# Patient Record
Sex: Male | Born: 1970 | Race: White | Hispanic: No | Marital: Married | Smoking: Never smoker
Health system: Southern US, Community
[De-identification: ages and names within clinical notes are randomized; demographics above are authoritative.]

## PROBLEM LIST (undated history)

## (undated) DIAGNOSIS — R000 Tachycardia, unspecified: Secondary | ICD-10-CM

## (undated) DIAGNOSIS — R030 Elevated blood-pressure reading, without diagnosis of hypertension: Secondary | ICD-10-CM

## (undated) DIAGNOSIS — E669 Obesity, unspecified: Secondary | ICD-10-CM

## (undated) DIAGNOSIS — IMO0001 Reserved for inherently not codable concepts without codable children: Secondary | ICD-10-CM

## (undated) DIAGNOSIS — J45909 Unspecified asthma, uncomplicated: Secondary | ICD-10-CM

## (undated) DIAGNOSIS — E785 Hyperlipidemia, unspecified: Secondary | ICD-10-CM

## (undated) DIAGNOSIS — T7840XA Allergy, unspecified, initial encounter: Secondary | ICD-10-CM

## (undated) HISTORY — DX: Unspecified asthma, uncomplicated: J45.909

## (undated) HISTORY — DX: Obesity, unspecified: E66.9

## (undated) HISTORY — DX: Allergy, unspecified, initial encounter: T78.40XA

## (undated) HISTORY — DX: Reserved for inherently not codable concepts without codable children: IMO0001

## (undated) HISTORY — DX: Tachycardia, unspecified: R00.0

## (undated) HISTORY — DX: Hyperlipidemia, unspecified: E78.5

## (undated) HISTORY — DX: Elevated blood-pressure reading, without diagnosis of hypertension: R03.0

## (undated) HISTORY — PX: TONSILECTOMY/ADENOIDECTOMY WITH MYRINGOTOMY: SHX6125

---

## 2005-04-10 ENCOUNTER — Emergency Department: Payer: Self-pay | Admitting: General Practice

## 2011-02-16 ENCOUNTER — Emergency Department: Payer: Self-pay | Admitting: Emergency Medicine

## 2011-02-16 LAB — RAPID INFLUENZA A&B ANTIGENS

## 2011-12-09 LAB — PSA: PSA: 0.3

## 2013-07-17 ENCOUNTER — Emergency Department: Payer: Self-pay | Admitting: Emergency Medicine

## 2013-12-20 LAB — LIPID PANEL
Cholesterol: 203 mg/dL — AB (ref 0–200)
HDL: 46 mg/dL (ref 35–70)
LDL CALC: 124 mg/dL
Triglycerides: 163 mg/dL — AB (ref 40–160)

## 2013-12-20 LAB — HEMOGLOBIN A1C: HEMOGLOBIN A1C: 5.5 % (ref 4.0–6.0)

## 2014-10-05 ENCOUNTER — Other Ambulatory Visit: Payer: Self-pay | Admitting: Family Medicine

## 2014-10-07 NOTE — Telephone Encounter (Signed)
Not able to inform patient, his number is disconnected

## 2014-11-09 ENCOUNTER — Other Ambulatory Visit: Payer: Self-pay | Admitting: Family Medicine

## 2014-11-11 ENCOUNTER — Encounter: Payer: Self-pay | Admitting: Family Medicine

## 2014-11-11 NOTE — Telephone Encounter (Signed)
A letter has been printed and mailed per your request.

## 2014-11-11 NOTE — Telephone Encounter (Signed)
Per your request a letter has been printed out and mailed.

## 2014-11-11 NOTE — Telephone Encounter (Signed)
Have tried contacting patient, however, the number is disconnected. I also checked in the old system and it was the same number.

## 2014-12-10 ENCOUNTER — Ambulatory Visit (INDEPENDENT_AMBULATORY_CARE_PROVIDER_SITE_OTHER): Payer: Managed Care, Other (non HMO) | Admitting: Family Medicine

## 2014-12-10 ENCOUNTER — Encounter: Payer: Self-pay | Admitting: Family Medicine

## 2014-12-10 ENCOUNTER — Encounter (INDEPENDENT_AMBULATORY_CARE_PROVIDER_SITE_OTHER): Payer: Self-pay

## 2014-12-10 VITALS — BP 130/86 | HR 93 | Temp 98.5°F | Resp 16 | Ht 67.0 in | Wt 258.0 lb

## 2014-12-10 DIAGNOSIS — J4540 Moderate persistent asthma, uncomplicated: Secondary | ICD-10-CM

## 2014-12-10 DIAGNOSIS — J302 Other seasonal allergic rhinitis: Secondary | ICD-10-CM | POA: Insufficient documentation

## 2014-12-10 DIAGNOSIS — J309 Allergic rhinitis, unspecified: Secondary | ICD-10-CM | POA: Diagnosis not present

## 2014-12-10 DIAGNOSIS — Z79899 Other long term (current) drug therapy: Secondary | ICD-10-CM | POA: Diagnosis not present

## 2014-12-10 DIAGNOSIS — J3089 Other allergic rhinitis: Secondary | ICD-10-CM

## 2014-12-10 DIAGNOSIS — Z23 Encounter for immunization: Secondary | ICD-10-CM

## 2014-12-10 DIAGNOSIS — E785 Hyperlipidemia, unspecified: Secondary | ICD-10-CM | POA: Diagnosis not present

## 2014-12-10 DIAGNOSIS — E66813 Obesity, class 3: Secondary | ICD-10-CM | POA: Insufficient documentation

## 2014-12-10 MED ORDER — MONTELUKAST SODIUM 10 MG PO TABS: 10.0000 mg | ORAL_TABLET | Freq: Every day | ORAL | Status: DC

## 2014-12-10 MED ORDER — LEVOCETIRIZINE DIHYDROCHLORIDE 5 MG PO TABS: 5.0000 mg | ORAL_TABLET | Freq: Every evening | ORAL | Status: DC

## 2014-12-10 MED ORDER — ALBUTEROL SULFATE HFA 108 (90 BASE) MCG/ACT IN AERS: 2.0000 | INHALATION_SPRAY | Freq: Four times a day (QID) | RESPIRATORY_TRACT | Status: DC | PRN

## 2014-12-10 MED ORDER — BUDESONIDE-FORMOTEROL FUMARATE 160-4.5 MCG/ACT IN AERO: 2.0000 | INHALATION_SPRAY | Freq: Two times a day (BID) | RESPIRATORY_TRACT | Status: DC

## 2014-12-10 NOTE — Progress Notes (Signed)
Name: Matthew Barrett   MRN: [id number]    DOB: Jun 06, 1970   Date:12/10/2014       Progress Note  Subjective  Chief Complaint  Chief Complaint  Patient presents with  . Medication Refill    follow-up  . Allergic Rhinitis     sinus  . Asthma    SOB, wheezing    HPI  Asthma: He has symptoms more than twice weekly described as wheezing or SOB, with some limitations, usually does not wake up at night because of asthma, using albuterol in the afternoon, he is only using Symbicort one puff once of twice daily and not as prescribed  AR: symptoms worse this time of the year, nasal congestion and sinus pressure. He has not been using nasal steroid because he does not like it.   Obesity: he has gained weight since last visit, has SOB with activity, not very active, and is still drinking sodas. Discussed increase risk of morbidity and mortality and importance of life style modifications  Patient Active Problem List   Diagnosis Date Noted  . Perennial allergic rhinitis with seasonal variation 12/10/2014  . Asthma, moderate persistent 12/10/2014  . Dyslipidemia 12/10/2014  . Obesity, Class III, BMI 40-49.9 (morbid obesity) (HCC) 12/10/2014    Past Surgical History  Procedure Laterality Date  . Tonsilectomy/adenoidectomy with myringotomy      Family History  Problem Relation Age of Onset  . Hyperlipidemia Mother   . Hypertension Mother   . Hypothyroidism Mother   . Hyperlipidemia Sister   . Hypertension Sister   . Asthma Sister   . CAD Maternal Grandfather   . Hypertension Maternal Grandfather   . Congestive Heart Failure Paternal Grandfather     Social History   Social History  . Marital Status: Married    Spouse Name: N/A  . Number of Children: N/A  . Years of Education: N/A   Occupational History  . Not on file.   Social History Main Topics  . Smoking status: Never Smoker   . Smokeless tobacco: Never Used  . Alcohol Use: No  . Drug Use: Yes  . Sexual Activity:  Yes   Other Topics Concern  . Not on file   Social History Narrative  . No narrative on file     Current outpatient prescriptions:  .  albuterol (VENTOLIN HFA) 108 (90 BASE) MCG/ACT inhaler, Inhale 2 puffs into the lungs every 6 (six) hours as needed for wheezing or shortness of breath., Disp: 18 each, Rfl: 1 .  budesonide-formoterol (SYMBICORT) 160-4.5 MCG/ACT inhaler, Inhale 2 puffs into the lungs 2 (two) times daily., Disp: 1 Inhaler, Rfl: 2 .  levocetirizine (XYZAL) 5 MG tablet, Take 1 tablet (5 mg total) by mouth every evening., Disp: 90 tablet, Rfl: 1 .  montelukast (SINGULAIR) 10 MG tablet, Take 1 tablet (10 mg total) by mouth daily., Disp: 90 tablet, Rfl: 1  Allergies  Allergen Reactions  . Aspirin   . Tetanus Toxoids      ROS  Constitutional: Negative for fever some weight change.  Respiratory: Negative for cough , occasional  shortness of breath.   Cardiovascular: Negative for chest pain or palpitations.  Gastrointestinal: Negative for abdominal pain, no bowel changes.  Musculoskeletal: Negative for gait problem or joint swelling.  Skin: Negative for rash.  Neurological: Negative for dizziness or headache.  No other specific complaints in a complete review of systems (except as listed in HPI above).  Objective  Filed Vitals:   12/10/14 1022  BP: 130/86  Pulse: 93  Temp: 98.5 F (36.9 C)  TempSrc: Oral  Resp: 16  Height: 5\' 7"  (1.702 m)  Weight: 258 lb (117.028 kg)  SpO2: 96%    Body mass index is 40.4 kg/(m^2).  Physical Exam  Constitutional: Patient appears well-developed and well-nourished. Obese No distress.  HEENT: head atraumatic, normocephalic, pupils equal and reactive to light, TM scarring on both sides - history of tubes as a child, neck supple, throat within normal limits Cardiovascular: Normal rate, regular rhythm and normal heart sounds.  No murmur heard. No BLE edema. Pulmonary/Chest: Effort normal and breath sounds normal. No respiratory  distress. Abdominal: Soft.  There is no tenderness. Psychiatric: Patient has a normal mood and affect. behavior is normal. Judgment and thought content normal.  PHQ2/9: Depression screen PHQ 2/9 12/10/2014  Decreased Interest 0  Down, Depressed, Hopeless 0  PHQ - 2 Score 0     Fall Risk: Fall Risk  12/10/2014  Falls in the past year? Yes  Number falls in past yr: 1  Injury with Fall? Yes     Functional Status Survey: Is the patient deaf or have difficulty hearing?: No Does the patient have difficulty seeing, even when wearing glasses/contacts?: No Does the patient have difficulty concentrating, remembering, or making decisions?: No Does the patient have difficulty walking or climbing stairs?: No Does the patient have difficulty dressing or bathing?: No Does the patient have difficulty doing errands alone such as visiting a doctor's office or shopping?: No    Assessment & Plan  1. Asthma, moderate persistent, uncomplicated  Only using Symbicort one puff twice daily , advised to take it as directed, asthma not controlled, recheck spirometry next visit - albuterol (VENTOLIN HFA) 108 (90 BASE) MCG/ACT inhaler; Inhale 2 puffs into the lungs every 6 (six) hours as needed for wheezing or shortness of breath.  Dispense: 18 each; Refill: 1 - budesonide-formoterol (SYMBICORT) 160-4.5 MCG/ACT inhaler; Inhale 2 puffs into the lungs 2 (two) times daily.  Dispense: 1 Inhaler; Refill: 2 - montelukast (SINGULAIR) 10 MG tablet; Take 1 tablet (10 mg total) by mouth daily.  Dispense: 90 tablet; Refill: 1  2. Obesity, Class III, BMI 40-49.9 (morbid obesity) (HCC)  Discussed with the patient the risk posed by an increased BMI. Discussed importance of portion control, calorie counting and at least 150 minutes of physical activity weekly. Avoid sweet beverages and drink more water. Eat at least 6 servings of fruit and vegetables daily   3. Needs flu shot  - Flu Vaccine QUAD 36+ mos PF IM  (Fluarix & Fluzone Quad PF)  4. Perennial allergic rhinitis with seasonal variation  He does not like nasal spray - montelukast (SINGULAIR) 10 MG tablet; Take 1 tablet (10 mg total) by mouth daily.  Dispense: 90 tablet; Refill: 1 - levocetirizine (XYZAL) 5 MG tablet; Take 1 tablet (5 mg total) by mouth every evening.  Dispense: 90 tablet; Refill: 1  5. Dyslipidemia  -Lipid and comp panel

## 2015-03-12 ENCOUNTER — Other Ambulatory Visit
Admission: RE | Admit: 2015-03-12 | Discharge: 2015-03-12 | Disposition: A | Discharge status: Home / Self Care | Payer: 59 | Source: Ambulatory Visit | Attending: Family Medicine | Admitting: Family Medicine

## 2015-03-12 ENCOUNTER — Other Ambulatory Visit: Payer: Self-pay

## 2015-03-12 ENCOUNTER — Ambulatory Visit (INDEPENDENT_AMBULATORY_CARE_PROVIDER_SITE_OTHER): Payer: 59 | Admitting: Family Medicine

## 2015-03-12 ENCOUNTER — Encounter: Payer: Self-pay | Admitting: Family Medicine

## 2015-03-12 VITALS — BP 144/96 | HR 96 | Temp 97.8°F | Resp 18 | Ht 67.0 in | Wt 265.3 lb

## 2015-03-12 DIAGNOSIS — IMO0001 Reserved for inherently not codable concepts without codable children: Secondary | ICD-10-CM

## 2015-03-12 DIAGNOSIS — E66813 Obesity, class 3: Secondary | ICD-10-CM

## 2015-03-12 DIAGNOSIS — E785 Hyperlipidemia, unspecified: Secondary | ICD-10-CM | POA: Insufficient documentation

## 2015-03-12 DIAGNOSIS — J302 Other seasonal allergic rhinitis: Secondary | ICD-10-CM

## 2015-03-12 DIAGNOSIS — R030 Elevated blood-pressure reading, without diagnosis of hypertension: Secondary | ICD-10-CM | POA: Diagnosis not present

## 2015-03-12 DIAGNOSIS — J309 Allergic rhinitis, unspecified: Secondary | ICD-10-CM

## 2015-03-12 DIAGNOSIS — M545 Low back pain, unspecified: Secondary | ICD-10-CM

## 2015-03-12 DIAGNOSIS — Z79899 Other long term (current) drug therapy: Secondary | ICD-10-CM | POA: Diagnosis not present

## 2015-03-12 DIAGNOSIS — J4540 Moderate persistent asthma, uncomplicated: Secondary | ICD-10-CM | POA: Diagnosis not present

## 2015-03-12 DIAGNOSIS — J3089 Other allergic rhinitis: Secondary | ICD-10-CM

## 2015-03-12 LAB — COMPREHENSIVE METABOLIC PANEL
ALT: 52 U/L (ref 17–63)
AST: 31 U/L (ref 15–41)
Albumin: 4.1 g/dL (ref 3.5–5.0)
Alkaline Phosphatase: 75 U/L (ref 38–126)
Anion gap: 6 (ref 5–15)
BILIRUBIN TOTAL: 0.7 mg/dL (ref 0.3–1.2)
BUN: 14 mg/dL (ref 6–20)
CO2: 28 mmol/L (ref 22–32)
CREATININE: 1.04 mg/dL (ref 0.61–1.24)
Calcium: 9 mg/dL (ref 8.9–10.3)
Chloride: 104 mmol/L (ref 101–111)
GFR calc Af Amer: >60 mL/min (ref >60)
Glucose, Bld: 90 mg/dL (ref 65–99)
Potassium: 4.6 mmol/L (ref 3.5–5.1)
Sodium: 138 mmol/L (ref 135–145)
TOTAL PROTEIN: 7.4 g/dL (ref 6.5–8.1)

## 2015-03-12 LAB — LIPID PANEL
CHOLESTEROL: 209 mg/dL — AB (ref 0–200)
HDL: 49 mg/dL (ref >40)
LDL CALC: 131 mg/dL — AB (ref 0–99)
TRIGLYCERIDES: 146 mg/dL (ref <150)
Total CHOL/HDL Ratio: 4.3 RATIO
VLDL: 29 mg/dL (ref 0–40)

## 2015-03-12 MED ORDER — ALBUTEROL SULFATE HFA 108 (90 BASE) MCG/ACT IN AERS: 2.0000 | INHALATION_SPRAY | Freq: Four times a day (QID) | RESPIRATORY_TRACT | Status: DC | PRN

## 2015-03-12 MED ORDER — BUDESONIDE-FORMOTEROL FUMARATE 160-4.5 MCG/ACT IN AERO: 2.0000 | INHALATION_SPRAY | Freq: Two times a day (BID) | RESPIRATORY_TRACT | Status: DC

## 2015-03-12 MED ORDER — METAXALONE 800 MG PO TABS: 800.0000 mg | ORAL_TABLET | Freq: Three times a day (TID) | ORAL | Status: DC | PRN

## 2015-03-12 NOTE — Telephone Encounter (Signed)
done

## 2015-03-12 NOTE — Progress Notes (Signed)
Name: Matthew Barrett   MRN: [id number]    DOB: 1970/04/22   Date:03/12/2015       Progress Note  Subjective  Chief Complaint  Chief Complaint  Patient presents with  . Asthma    patient is here for his 46-month follow-up. patient stated that it has not been good, especially at night.  . Medication Refill  . Back Pain    for the past week he has been having some lower back pain. ? if it is a strained muscle.    HPI  Asthma: He states he is doing better since resumed Symbicort three months ago, he states that during the day he has no problems, but in the evening , when sitting in the recliner he gets SOB and uses a rescue inhaler. Advised to try getting up and taking a deep breath. He has gained a lot of weight lately.   AR: symptoms He still has  nasal congestion and sinus pressure. He had an URI recently and is still stuffy. Using nasal steroid now.   Obesity: he has gained weight since last visit, he  is still drinking sodas. Discussed increase risk of morbidity and mortality and importance of life style modifications. He states he will start taking his health seriously now.   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 He describes as low aching back pain, no radiation. He has noticed some stiffness at the end of the day. No numbness or weakness, taking otc medication with some improvement of symptoms.   Patient Active Problem List   Diagnosis Date Noted  . Perennial allergic rhinitis with seasonal variation 12/10/2014  . Asthma, moderate persistent 12/10/2014  . Dyslipidemia 12/10/2014  . Obesity, Class III, BMI 40-49.9 (morbid obesity) (HCC) 12/10/2014    Past Surgical History  Procedure Laterality Date  . Tonsilectomy/adenoidectomy with myringotomy      Family History  Problem Relation Age of Onset  . Hyperlipidemia Mother   . Hypertension Mother   . Hypothyroidism Mother   .  Hyperlipidemia Sister   . Hypertension Sister   . Asthma Sister   . CAD Maternal Grandfather   . Hypertension Maternal Grandfather   . Congestive Heart Failure Paternal Grandfather     Social History   Social History  . Marital Status: Married    Spouse Name: N/A  . Number of Children: N/A  . Years of Education: N/A   Occupational History  . Not on file.   Social History Main Topics  . Smoking status: Never Smoker   . Smokeless tobacco: Never Used  . Alcohol Use: No  . Drug Use: Yes  . Sexual Activity: Yes   Other Topics Concern  . Not on file   Social History Narrative     Current outpatient prescriptions:  .  albuterol (VENTOLIN HFA) 108 (90 BASE) MCG/ACT inhaler, Inhale 2 puffs into the lungs every 6 (six) hours as needed for wheezing or shortness of breath., Disp: 18 each, Rfl: 1 .  budesonide-formoterol (SYMBICORT) 160-4.5 MCG/ACT inhaler, Inhale 2 puffs into the lungs 2 (two) times daily., Disp: 1 Inhaler, Rfl: 2 .  levocetirizine (XYZAL) 5 MG tablet, Take 1 tablet (5 mg total) by mouth every evening., Disp: 90 tablet, Rfl: 1 .  montelukast (SINGULAIR) 10 MG tablet, Take 1 tablet (10 mg total) by mouth daily., Disp: 90 tablet, Rfl: 1  Allergies  Allergen Reactions  .  Aspirin   . Tetanus Toxoids      ROS  Constitutional: Negative for fever, positive for weight change.  Respiratory: Positive  for cough and shortness of breath.   Cardiovascular: Negative for chest pain or palpitations.  Gastrointestinal: Negative for abdominal pain, no bowel changes.  Musculoskeletal: Negative for gait problem or joint swelling.  Skin: Negative for rash.  Neurological: Negative for dizziness or headache.  No other specific complaints in a complete review of systems (except as listed in HPI above).  Objective  Filed Vitals:   03/12/15 0901  BP: 132/92  Pulse: 96  Temp: 97.8 F (36.6 C)  TempSrc: Oral  Resp: 18  Height:  (1.702 m)  Weight: 265 lb 4.8 oz  (120.339 kg)  SpO2: 97%    Body mass index is 41.54 kg/(m^2).  Physical Exam  Constitutional: Patient appears well-developed and well-nourished. Obese  No distress.  HEENT: head atraumatic, normocephalic, pupils equal and reactive to light, ears TM right side has scarring , neck supple, throat within normal limits Cardiovascular: Normal rate, regular rhythm and normal heart sounds.  No murmur heard. No BLE edema. Pulmonary/Chest: Effort normal and breath sounds normal. No respiratory distress. Abdominal: Soft.  There is no tenderness. Psychiatric: Patient has a normal mood and affect. behavior is normal. Judgment and thought content normal.  PHQ2/9: Depression screen [facility] 2/9 03/12/2015 12/10/2014  Decreased Interest 0 0  Down, Depressed, Hopeless 0 0  PHQ - 2 Score 0 0     Fall Risk: Fall Risk  03/12/2015 12/10/2014  Falls in the past year? No Yes  Number falls in past yr: - 1  Injury with Fall? - Yes     Functional Status Survey: Is the patient deaf or have difficulty hearing?: No (patient has problems with his ears but not with hearing. hearing difficulty runs in his family.) Does the patient have difficulty seeing, even when wearing glasses/contacts?: No Does the patient have difficulty concentrating, remembering, or making decisions?: No Does the patient have difficulty walking or climbing stairs?: No Does the patient have difficulty dressing or bathing?: No Does the patient have difficulty doing errands alone such as visiting a doctor's office or shopping?: No    Assessment & Plan  1. Intermittent low back pain  We will try muscle relaxer and avoid taking nsaid's on regular basis - metaxalone (SKELAXIN) 800 MG tablet; Take 1 tablet (800 mg total) by mouth 3 (three) times daily as needed for muscle spasms.  Dispense: 90 tablet; Refill: 0  2. Obesity, Class III, BMI 40-49.9 (morbid obesity) (HCC)  Discussed with the patient the risk posed by an increased BMI.  Discussed importance of portion control, calorie counting and at least 150 minutes of physical activity weekly. Avoid sweet beverages and drink more water. Eat at least 6 servings of fruit and vegetables daily   3. Perennial allergic rhinitis with seasonal variation  Continue medication   4. Dyslipidemia   He needs to have labs done  5. Asthma, moderate persistent, uncomplicated  Advised to lose weight and get up to breath when he gets tired at night - budesonide-formoterol (SYMBICORT) 160-4.5 MCG/ACT inhaler; Inhale 2 puffs into the lungs 2 (two) times daily.  Dispense: 1 Inhaler; Refill: 2 - albuterol (VENTOLIN HFA) 108 (90 Base) MCG/ACT inhaler; Inhale 2 puffs into the lungs every 6 (six) hours as needed for wheezing or shortness of breath.  Dispense: 18 each; Refill: 0  6. Elevated blood pressure  He will change his diet, lose  weight and avoid caffeine, we will recheck bp in 3 months and if still high we will start medication

## 2015-03-12 NOTE — Telephone Encounter (Signed)
Got a fax from [facility] stating, "MUST USE PROAIR PER INS."  Refill request was sent to Dr. Alba Cory for approval and submission.

## 2015-03-27 ENCOUNTER — Other Ambulatory Visit: Payer: Self-pay | Admitting: Family Medicine

## 2015-03-27 MED ORDER — FLUTICASONE FUROATE-VILANTEROL 100-25 MCG/INH IN AEPB: 1.0000 | INHALATION_SPRAY | Freq: Every day | RESPIRATORY_TRACT | Status: DC

## 2015-06-11 ENCOUNTER — Ambulatory Visit: Payer: 59 | Admitting: Family Medicine

## 2015-06-19 ENCOUNTER — Other Ambulatory Visit: Payer: Self-pay | Admitting: Family Medicine

## 2015-06-19 NOTE — Telephone Encounter (Signed)
Patient requesting refill. 

## 2015-06-20 NOTE — Telephone Encounter (Signed)
Left voice message to inform patient that prescription has been called in and he need to schedule appt

## 2015-06-23 ENCOUNTER — Other Ambulatory Visit: Payer: Self-pay | Admitting: Family Medicine

## 2015-06-24 NOTE — Telephone Encounter (Signed)
Patient requesting refill. 

## 2015-07-02 ENCOUNTER — Other Ambulatory Visit: Payer: Self-pay | Admitting: Family Medicine

## 2015-07-03 ENCOUNTER — Encounter: Payer: Self-pay | Admitting: Family Medicine

## 2015-07-03 ENCOUNTER — Ambulatory Visit (INDEPENDENT_AMBULATORY_CARE_PROVIDER_SITE_OTHER): Payer: 59 | Admitting: Family Medicine

## 2015-07-03 VITALS — BP 140/80 | HR 110 | Temp 98.4°F | Resp 16 | Ht 67.0 in | Wt 259.7 lb

## 2015-07-03 DIAGNOSIS — J40 Bronchitis, not specified as acute or chronic: Secondary | ICD-10-CM | POA: Diagnosis not present

## 2015-07-03 DIAGNOSIS — J3089 Other allergic rhinitis: Secondary | ICD-10-CM

## 2015-07-03 DIAGNOSIS — J4540 Moderate persistent asthma, uncomplicated: Secondary | ICD-10-CM

## 2015-07-03 DIAGNOSIS — J309 Allergic rhinitis, unspecified: Secondary | ICD-10-CM

## 2015-07-03 DIAGNOSIS — J302 Other seasonal allergic rhinitis: Secondary | ICD-10-CM

## 2015-07-03 MED ORDER — LEVOCETIRIZINE DIHYDROCHLORIDE 5 MG PO TABS: 5.0000 mg | ORAL_TABLET | Freq: Every evening | ORAL | Status: DC

## 2015-07-03 MED ORDER — FLUTICASONE FUROATE-VILANTEROL 100-25 MCG/INH IN AEPB
1.0000 | INHALATION_SPRAY | Freq: Every day | RESPIRATORY_TRACT | Status: DC
Start: 2015-07-03 — End: 2015-11-24

## 2015-07-03 MED ORDER — ALBUTEROL SULFATE HFA 108 (90 BASE) MCG/ACT IN AERS: 2.0000 | INHALATION_SPRAY | Freq: Four times a day (QID) | RESPIRATORY_TRACT | Status: DC | PRN

## 2015-07-03 MED ORDER — AZITHROMYCIN 250 MG PO TABS: ORAL_TABLET | ORAL | Status: DC

## 2015-07-03 MED ORDER — BENZONATATE 100 MG PO CAPS: 100.0000 mg | ORAL_CAPSULE | Freq: Two times a day (BID) | ORAL | Status: DC | PRN

## 2015-07-03 NOTE — Progress Notes (Signed)
Name: Matthew Barrett   MRN: [id number]    DOB: [birth year]   Date:07/03/2015       Progress Note  Subjective  Chief Complaint  Chief Complaint  Patient presents with  . URI    cough, congested for 1 week    Cough This is a new problem. The current episode started in the past 7 days. The cough is productive of sputum. Associated symptoms include nasal congestion, postnasal drip and a sore throat. Pertinent negatives include no chills, fever, headaches or shortness of breath. Treatments tried: Ibuprofen and Allegra.   Asthma: Pt. Requesting refill for Breo Inhaler, taken daily for maintenance therapy for Asthma.    Seasonal Allergies:  Pt. Is also requesting a refill for Xyzal for seasonal allergies, recently ran out and is now taking OTC Allegra, which is not as effective as Xyzal.  Past Medical History  Diagnosis Date  . Hyperlipidemia   . Tachycardia   . Elevated blood pressure   . Obesity   . Allergy   . Asthma     Past Surgical History  Procedure Laterality Date  . Tonsilectomy/adenoidectomy with myringotomy      Family History  Problem Relation Age of Onset  . Hyperlipidemia Mother   . Hypertension Mother   . Hypothyroidism Mother   . Hyperlipidemia Sister   . Hypertension Sister   . Asthma Sister   . CAD Maternal Grandfather   . Hypertension Maternal Grandfather   . Congestive Heart Failure Paternal Grandfather     Social History   Social History  . Marital Status: Married    Spouse Name: N/A  . Number of Children: N/A  . Years of Education: N/A   Occupational History  . Not on file.   Social History Main Topics  . Smoking status: Never Smoker   . Smokeless tobacco: Never Used  . Alcohol Use: No  . Drug Use: Yes  . Sexual Activity: Yes   Other Topics Concern  . Not on file   Social History Narrative     Current outpatient prescriptions:  .  Fluticasone Furoate-Vilanterol (BREO ELLIPTA) 100-25 MCG/INH AEPB, Inhale 1 puff into the lungs  daily., Disp: 60 each, Rfl: 2 .  levocetirizine (XYZAL) 5 MG tablet, Take 1 tablet (5 mg total) by mouth every evening., Disp: 90 tablet, Rfl: 1 .  metaxalone (SKELAXIN) 800 MG tablet, Take 1 tablet (800 mg total) by mouth 3 (three) times daily as needed for muscle spasms., Disp: 90 tablet, Rfl: 0 .  montelukast (SINGULAIR) 10 MG tablet, Take 1 tablet (10 mg total) by mouth daily., Disp: 90 tablet, Rfl: 1 .  PROAIR HFA 108 (90 Base) MCG/ACT inhaler, INHALE TWO PUFFS BY MOUTH EVERY 6 HOURS AS NEEDED FOR WHEEZING OR SHORTNESS OF BREATH, Disp: 1 each, Rfl: 0  Allergies  Allergen Reactions  . Aspirin   . Tetanus Toxoids     Review of Systems  Constitutional: Negative for fever and chills.  HENT: Positive for congestion, postnasal drip and sore throat.   Respiratory: Positive for cough and sputum production. Negative for shortness of breath.   Neurological: Negative for headaches.    Objective  Filed Vitals:   07/03/15 1518  BP: 144/82  Pulse: 110  Temp: 98.4 F (36.9 C)  TempSrc: Oral  Resp: 16  Height:  (1.702 m)  Weight: 259 lb 11.2 oz (117.799 kg)  SpO2: 96%    Physical Exam  Constitutional: He is well-developed, well-nourished, and in no distress.  HENT:  Nose: Right sinus exhibits no maxillary sinus tenderness and no frontal sinus tenderness. Left sinus exhibits no maxillary sinus tenderness and no frontal sinus tenderness.  Mouth/Throat: Posterior oropharyngeal erythema present.  Cardiovascular: Normal heart sounds.  Tachycardia present.   Pulmonary/Chest: No respiratory distress. He has wheezes. He has no rhonchi.  Nursing note and vitals reviewed.    Assessment & Plan  1. Asthma, moderate persistent, uncomplicated Refill for maintenance and rescue inhalers provided. - fluticasone furoate-vilanterol (BREO ELLIPTA) 100-25 MCG/INH AEPB; Inhale 1 puff into the lungs daily.  Dispense: 60 each; Refill: 2 - albuterol (PROAIR HFA) 108 (90 Base) MCG/ACT inhaler;  Inhale 2 puffs into the lungs every 6 (six) hours as needed for wheezing or shortness of breath.  Dispense: 1 each; Refill: 2  2. Perennial allergic rhinitis with seasonal variation  - levocetirizine (XYZAL) 5 MG tablet; Take 1 tablet (5 mg total) by mouth every evening.  Dispense: 90 tablet; Refill: 1  3. Bronchitis We will start on Z-Pak and antitussive for symptom resolution. If no improvement within 2-3 days, may need a CXR. - azithromycin (ZITHROMAX) 250 MG tablet; 2 tabs po day 1, then 1 tab po q day x 4 days  Dispense: 6 tablet; Refill: 0 - benzonatate (TESSALON) 100 MG capsule; Take 1 capsule (100 mg total) by mouth 2 (two) times daily as needed for cough.  Dispense: 20 capsule; Refill: 0   Matthew Barrett Matthew Barrett [facility] 07/03/2015 3:28 PM

## 2015-08-01 ENCOUNTER — Ambulatory Visit: Payer: 59 | Admitting: Family Medicine

## 2015-08-20 ENCOUNTER — Ambulatory Visit: Payer: 59 | Admitting: Family Medicine

## 2015-09-23 ENCOUNTER — Encounter: Payer: 59 | Admitting: Family Medicine

## 2015-11-24 ENCOUNTER — Ambulatory Visit (INDEPENDENT_AMBULATORY_CARE_PROVIDER_SITE_OTHER): Payer: 59 | Admitting: Family Medicine

## 2015-11-24 ENCOUNTER — Encounter: Payer: Self-pay | Admitting: Family Medicine

## 2015-11-24 VITALS — BP 142/100 | HR 103 | Temp 97.7°F | Resp 18 | Ht 67.0 in | Wt 264.4 lb

## 2015-11-24 DIAGNOSIS — J302 Other seasonal allergic rhinitis: Secondary | ICD-10-CM | POA: Diagnosis not present

## 2015-11-24 DIAGNOSIS — J3089 Other allergic rhinitis: Secondary | ICD-10-CM | POA: Diagnosis not present

## 2015-11-24 DIAGNOSIS — Z23 Encounter for immunization: Secondary | ICD-10-CM

## 2015-11-24 DIAGNOSIS — Z0000 Encounter for general adult medical examination without abnormal findings: Secondary | ICD-10-CM

## 2015-11-24 DIAGNOSIS — J4540 Moderate persistent asthma, uncomplicated: Secondary | ICD-10-CM | POA: Diagnosis not present

## 2015-11-24 DIAGNOSIS — Z6841 Body Mass Index (BMI) 40.0 and over, adult: Secondary | ICD-10-CM

## 2015-11-24 DIAGNOSIS — I10 Essential (primary) hypertension: Secondary | ICD-10-CM

## 2015-11-24 DIAGNOSIS — E785 Hyperlipidemia, unspecified: Secondary | ICD-10-CM | POA: Diagnosis not present

## 2015-11-24 DIAGNOSIS — Z131 Encounter for screening for diabetes mellitus: Secondary | ICD-10-CM | POA: Diagnosis not present

## 2015-11-24 LAB — CBC WITH DIFFERENTIAL/PLATELET
BASOS PCT: 0 %
Basophils Absolute: 0 cells/uL (ref 0–200)
EOS ABS: 198 {cells}/uL (ref 15–500)
Eosinophils Relative: 2 %
HEMATOCRIT: 46.5 % (ref 38.5–50.0)
HEMOGLOBIN: 15.4 g/dL (ref 13.2–17.1)
LYMPHS ABS: 2970 {cells}/uL (ref 850–3900)
LYMPHS PCT: 30 %
MCH: 30 pg (ref 27.0–33.0)
MCHC: 33.1 g/dL (ref 32.0–36.0)
MCV: 90.6 fL (ref 80.0–100.0)
MONO ABS: 693 {cells}/uL (ref 200–950)
MPV: 9 fL (ref 7.5–12.5)
Monocytes Relative: 7 %
NEUTROS PCT: 61 %
Neutro Abs: 6039 cells/uL (ref 1500–7800)
Platelets: 335 10*3/uL (ref 140–400)
RBC: 5.13 MIL/uL (ref 4.20–5.80)
RDW: 13.3 % (ref 11.0–15.0)
WBC: 9.9 10*3/uL (ref 3.8–10.8)

## 2015-11-24 LAB — LIPID PANEL
CHOL/HDL RATIO: 4.1 ratio (ref <=5.0)
Cholesterol: 194 mg/dL (ref 125–200)
HDL: 47 mg/dL (ref >=40)
LDL CALC: 118 mg/dL (ref <130)
Triglycerides: 145 mg/dL (ref <150)
VLDL: 29 mg/dL (ref <30)

## 2015-11-24 LAB — COMPLETE METABOLIC PANEL WITH GFR
ALBUMIN: 4.3 g/dL (ref 3.6–5.1)
ALK PHOS: 76 U/L (ref 40–115)
ALT: 40 U/L (ref 9–46)
AST: 26 U/L (ref 10–40)
BUN: 8 mg/dL (ref 7–25)
CALCIUM: 9.3 mg/dL (ref 8.6–10.3)
CHLORIDE: 104 mmol/L (ref 98–110)
CO2: 27 mmol/L (ref 20–31)
CREATININE: 1.09 mg/dL (ref 0.60–1.35)
GFR, Est Non African American: 83 mL/min (ref >=60)
Glucose, Bld: 79 mg/dL (ref 65–99)
Potassium: 5 mmol/L (ref 3.5–5.3)
Sodium: 141 mmol/L (ref 135–146)
Total Bilirubin: 0.4 mg/dL (ref 0.2–1.2)
Total Protein: 7.3 g/dL (ref 6.1–8.1)

## 2015-11-24 LAB — TSH: TSH: 1.8 m[IU]/L (ref 0.40–4.50)

## 2015-11-24 MED ORDER — FLUTICASONE FUROATE-VILANTEROL 100-25 MCG/INH IN AEPB: 1.0000 | INHALATION_SPRAY | Freq: Every day | RESPIRATORY_TRACT | 2 refills | Status: DC

## 2015-11-24 MED ORDER — LORCASERIN HCL ER 20 MG PO TB24: 1.0000 | ORAL_TABLET | Freq: Every day | ORAL | 1 refills | Status: DC

## 2015-11-24 MED ORDER — MONTELUKAST SODIUM 10 MG PO TABS: 10.0000 mg | ORAL_TABLET | Freq: Every day | ORAL | 1 refills | Status: DC

## 2015-11-24 MED ORDER — HYDROCHLOROTHIAZIDE 12.5 MG PO TABS
12.5000 mg | ORAL_TABLET | Freq: Every day | ORAL | 1 refills | Status: DC
Start: 2015-11-24 — End: 2016-05-28

## 2015-11-24 MED ORDER — LEVOCETIRIZINE DIHYDROCHLORIDE 5 MG PO TABS: 5.0000 mg | ORAL_TABLET | Freq: Every evening | ORAL | 1 refills | Status: DC

## 2015-11-24 NOTE — Progress Notes (Signed)
Name: Matthew Barrett   MRN: [id number]    DOB: August 01, 1970   Date:11/24/2015       Progress Note  Subjective  Chief Complaint  Chief Complaint  Patient presents with  . Annual Exam    HPI  Male Exam: he denies sexual, or urinary symptoms, he has gained some weight  Asthma: last flare was in May when he had bronchitis. He is taking Breo and singulair daily and states it seems to work all day, he only uses rescue inhaler at most twice week, usually at work it is very dusty and moving a lot.   AR: symptoms He still has  nasal congestion and sinus pressure, symptoms stable with medication  HTN: bp was elevated here in May and also at local pharmacy in the 140's range. He has been under more stress at work, because he is on managing position at the warehouse. He denies chest pain or palpitation, no weight loss  Obesity: he has gained weight since last visit, he is still drinking sodas. Discussed increase risk of morbidity and mortality and importance of life style modifications. He would like to take medication to help him lose weight, we will try Belviq   Patient Active Problem List   Diagnosis Date Noted  . Hypertension 11/24/2015  . Perennial allergic rhinitis with seasonal variation 12/10/2014  . Asthma, moderate persistent 12/10/2014  . Dyslipidemia 12/10/2014  . Obesity, Class III, BMI 40-49.9 (morbid obesity) (HCC) 12/10/2014    Past Surgical History:  Procedure Laterality Date  . TONSILECTOMY/ADENOIDECTOMY WITH MYRINGOTOMY      Family History  Problem Relation Age of Onset  . Hyperlipidemia Mother   . Hypertension Mother   . Hypothyroidism Mother   . Hyperlipidemia Sister   . Hypertension Sister   . Asthma Sister   . CAD Maternal Grandfather   . Hypertension Maternal Grandfather   . Congestive Heart Failure Paternal Grandfather     Social History   Social History  . Marital status: Married    Spouse name: N/A  . Number of children: N/A  . Years of  education: N/A   Occupational History  . Not on file.   Social History Main Topics  . Smoking status: Never Smoker  . Smokeless tobacco: Former Neurosurgeon    Types: Chew  . Alcohol use No  . Drug use: No  . Sexual activity: Yes    Partners: Female   Other Topics Concern  . Not on file   Social History Narrative  . No narrative on file     Current Outpatient Prescriptions:  .  albuterol (PROAIR HFA) 108 (90 Base) MCG/ACT inhaler, Inhale 2 puffs into the lungs every 6 (six) hours as needed for wheezing or shortness of breath., Disp: 1 each, Rfl: 2 .  fluticasone furoate-vilanterol (BREO ELLIPTA) 100-25 MCG/INH AEPB, Inhale 1 puff into the lungs daily., Disp: 60 each, Rfl: 2 .  levocetirizine (XYZAL) 5 MG tablet, Take 1 tablet (5 mg total) by mouth every evening., Disp: 90 tablet, Rfl: 1 .  montelukast (SINGULAIR) 10 MG tablet, Take 1 tablet (10 mg total) by mouth daily., Disp: 90 tablet, Rfl: 1  Allergies  Allergen Reactions  . Aspirin   . Tetanus Toxoids      ROS  Constitutional: Negative for fever, positive for weight change.  Respiratory: Negative for cough and shortness of breath.   Cardiovascular: Negative for chest pain or palpitations.  Gastrointestinal: Negative for abdominal pain, no bowel changes.  Musculoskeletal: Negative for gait problem  or joint swelling.  Skin: Negative for rash.  Neurological: Negative for dizziness , positive for intermittent headache.  No other specific complaints in a complete review of systems (except as listed in HPI above).  Objective  Vitals:   11/24/15 1336  BP: (!) 142/100  Pulse: (!) 103  Resp: 18  Temp: 97.7 F (36.5 C)  TempSrc: Oral  SpO2: 95%  Weight: 264 lb 6.4 oz (119.9 kg)  Height: 5\' 7"  (1.702 m)    Body mass index is 41.41 kg/m.  Physical Exam  Constitutional: Patient appears well-developed and obese No distress.  HENT: Head: Normocephalic and atraumatic. Ears: B TMs ok, no erythema or effusion; Nose: Nose  normal. Mouth/Throat: Oropharynx is clear and moist. No oropharyngeal exudate.  Eyes: Conjunctivae and EOM are normal. Pupils are equal, round, and reactive to light. No scleral icterus.  Neck: Normal range of motion. Neck supple. No JVD present. No thyromegaly present.  Cardiovascular: Normal rate, regular rhythm and normal heart sounds.  No murmur heard. No BLE edema. Pulmonary/Chest: Effort normal and breath sounds normal. No respiratory distress. Abdominal: Soft. Bowel sounds are normal, no distension. There is no tenderness. no masses MALE GENITALIA: Normal descended testes bilaterally, no masses palpated, no hernias, no lesions, no discharge RECTAL: not done Musculoskeletal: Normal range of motion, no joint effusions. No gross deformities Neurological: he is alert and oriented to person, place, and time. No cranial nerve deficit. Coordination, balance, strength, speech and gait are normal.  Skin: Skin is warm and dry. No rash noted. No erythema.  Psychiatric: Patient has a normal mood and affect. behavior is normal. Judgment and thought content normal.  PH[facility]2/9: [facility] 11/24/2015 07/03/2015 03/12/2015 12/10/2014  Decreased Interest 0 0 0 0  Down, Depressed, Hopeless 0 0 0 0  PHQ - 2 Score 0 0 0 0    Fall Risk: Fall Risk  11/24/2015 07/03/2015 03/12/2015 12/10/2014  Falls in the past year? No No No Yes  Number falls in past yr: - - - 1  Injury with Fall? - - - Yes     Functional Status Survey: Is the patient deaf or have difficulty hearing?: No Does the patient have difficulty seeing, even when wearing glasses/contacts?: No Does the patient have difficulty concentrating, remembering, or making decisions?: No Does the patient have difficulty walking or climbing stairs?: No Does the patient have difficulty dressing or bathing?: No Does the patient have difficulty doing errands alone such as visiting a doctor's office or shopping?: No    Assessment & Plan  1.  Well adult exam  Discussed importance of 150 minutes of physical activity weekly, eat two servings of fish weekly, eat one serving of tree nuts ( cashews, pistachios, pecans, almonds.Marland Kitchen.) every other day, eat 6 servings of fruit/vegetables daily and drink plenty of water and avoid sweet beverages.   2. Needs flu shot  - Flu Vaccine QUAD 36+ mos PF IM (Fluarix & Fluzone Quad PF)  3. Essential hypertension  - CBC with Differential/Platelet - COMPLETE METABOLIC PANEL WITH GFR - TSH  4. Obesity, Class III, BMI 40-49.9 (morbid obesity) (HCC)  Discussed with the patient the risk posed by an increased BMI. Discussed importance of portion control, calorie counting and at least 150 minutes of physical activity weekly. Avoid sweet beverages and drink more water. Eat at least 6 servings of fruit and vegetables daily   5. Perennial allergic rhinitis with seasonal variation  - levocetirizine (XYZAL) 5 MG tablet; Take 1 tablet (5 mg  total) by mouth every evening.  Dispense: 90 tablet; Refill: 1 - montelukast (SINGULAIR) 10 MG tablet; Take 1 tablet (10 mg total) by mouth daily.  Dispense: 90 tablet; Refill: 1  6. Dyslipidemia  - Lipid panel  7. Moderate persistent asthma without complication  - fluticasone furoate-vilanterol (BREO ELLIPTA) 100-25 MCG/INH AEPB; Inhale 1 puff into the lungs daily.  Dispense: 60 each; Refill: 2  8. Screening for diabetes mellitus (DM)  - Hemoglobin A1c

## 2015-11-25 LAB — HEMOGLOBIN A1C
HEMOGLOBIN A1C: 5.2 % (ref <5.7)
MEAN PLASMA GLUCOSE: 103 mg/dL

## 2016-01-13 ENCOUNTER — Ambulatory Visit: Payer: 59 | Admitting: Family Medicine

## 2016-01-18 ENCOUNTER — Other Ambulatory Visit: Payer: Self-pay | Admitting: Family Medicine

## 2016-01-18 DIAGNOSIS — J4540 Moderate persistent asthma, uncomplicated: Secondary | ICD-10-CM

## 2016-02-27 ENCOUNTER — Other Ambulatory Visit: Payer: Self-pay | Admitting: Family Medicine

## 2016-02-27 DIAGNOSIS — J4540 Moderate persistent asthma, uncomplicated: Secondary | ICD-10-CM

## 2016-03-05 ENCOUNTER — Other Ambulatory Visit: Payer: Self-pay

## 2016-03-05 MED ORDER — MOMETASONE FURO-FORMOTEROL FUM 100-5 MCG/ACT IN AERO: 2.0000 | INHALATION_SPRAY | Freq: Two times a day (BID) | RESPIRATORY_TRACT | 0 refills | Status: DC

## 2016-03-05 NOTE — Telephone Encounter (Signed)
lvm to schedule appt and to inform prescription has been sent to pharmacy

## 2016-05-07 ENCOUNTER — Ambulatory Visit: Payer: 59 | Admitting: Family Medicine

## 2016-05-21 ENCOUNTER — Other Ambulatory Visit: Payer: Self-pay | Admitting: Family Medicine

## 2016-05-21 DIAGNOSIS — J4540 Moderate persistent asthma, uncomplicated: Secondary | ICD-10-CM

## 2016-05-28 ENCOUNTER — Ambulatory Visit (INDEPENDENT_AMBULATORY_CARE_PROVIDER_SITE_OTHER): Payer: BLUE CROSS/BLUE SHIELD | Admitting: Family Medicine

## 2016-05-28 ENCOUNTER — Encounter: Payer: Self-pay | Admitting: Family Medicine

## 2016-05-28 VITALS — BP 138/98 | HR 94 | Temp 98.3°F | Resp 16 | Ht 67.0 in | Wt 270.4 lb

## 2016-05-28 DIAGNOSIS — R0683 Snoring: Secondary | ICD-10-CM

## 2016-05-28 DIAGNOSIS — J3089 Other allergic rhinitis: Secondary | ICD-10-CM

## 2016-05-28 DIAGNOSIS — I10 Essential (primary) hypertension: Secondary | ICD-10-CM

## 2016-05-28 DIAGNOSIS — E785 Hyperlipidemia, unspecified: Secondary | ICD-10-CM | POA: Diagnosis not present

## 2016-05-28 DIAGNOSIS — J4540 Moderate persistent asthma, uncomplicated: Secondary | ICD-10-CM

## 2016-05-28 DIAGNOSIS — E781 Pure hyperglyceridemia: Secondary | ICD-10-CM

## 2016-05-28 DIAGNOSIS — Z23 Encounter for immunization: Secondary | ICD-10-CM | POA: Diagnosis not present

## 2016-05-28 DIAGNOSIS — J302 Other seasonal allergic rhinitis: Secondary | ICD-10-CM | POA: Diagnosis not present

## 2016-05-28 DIAGNOSIS — E66813 Obesity, class 3: Secondary | ICD-10-CM

## 2016-05-28 MED ORDER — BUDESONIDE-FORMOTEROL FUMARATE 160-4.5 MCG/ACT IN AERO
2.0000 | INHALATION_SPRAY | Freq: Two times a day (BID) | RESPIRATORY_TRACT | 2 refills | Status: DC
Start: 2016-05-28 — End: 2016-09-21

## 2016-05-28 MED ORDER — MONTELUKAST SODIUM 10 MG PO TABS: 10.0000 mg | ORAL_TABLET | Freq: Every day | ORAL | 1 refills | Status: DC

## 2016-05-28 MED ORDER — LEVOCETIRIZINE DIHYDROCHLORIDE 5 MG PO TABS
5.0000 mg | ORAL_TABLET | Freq: Every evening | ORAL | 1 refills | Status: DC
Start: 2016-05-28 — End: 2017-08-08

## 2016-05-28 MED ORDER — OLMESARTAN MEDOXOMIL-HCTZ 40-25 MG PO TABS: 0.5000 | ORAL_TABLET | Freq: Every day | ORAL | 0 refills | Status: DC

## 2016-05-28 NOTE — Progress Notes (Addendum)
Name: Matthew Barrett   MRN: [id number]    DOB: July 30, 1970   Date:05/28/2016       Progress Note  Subjective  Chief Complaint  Chief Complaint  Patient presents with  . Asthma  . Medication Refill    HPI  Asthma:he is now off Dulera because of cost. He states cough was severe a couple of weeks ago with weather changes, and had to use rescue inhaler more often. He states he usually does well on medication. He has intermittent SOB or wheezing, he also has a dry cough a couple of times a week.   AR: symptoms He shas nasal congestion, sneezing and rhinorrhea, watery eyes and it is worse this time of the year  HTN: bp hs been elevated even with HCTZ , at local pharmacy 140's/100's, no chest pain or palpitation. He has changed his diet, avoiding a lot of caffeine and salt. Still loves coffee   Obesity: he has gained weight since last visit, he is still drinking sodas but cutting down, at most 20 ounces per day. . Discussed increase risk of morbidity and mortality and importance of life style modifications. He never filled Belviq because of possible side effects.   Dyslipidemia: triglycerides has improved, but he has features of metabolic syndrome.      Patient Active Problem List   Diagnosis Date Noted  . Hypertriglyceridemia 05/28/2016  . Hypertension 11/24/2015  . Perennial allergic rhinitis with seasonal variation 12/10/2014  . Asthma, moderate persistent 12/10/2014  . Dyslipidemia 12/10/2014  . Obesity, Class III, BMI 40-49.9 (morbid obesity) (HCC) 12/10/2014    Past Surgical History:  Procedure Laterality Date  . TONSILECTOMY/ADENOIDECTOMY WITH MYRINGOTOMY      Family History  Problem Relation Age of Onset  . Hyperlipidemia Mother   . Hypertension Mother   . Hypothyroidism Mother   . Hyperlipidemia Sister   . Hypertension Sister   . Asthma Sister   . CAD Maternal Grandfather   . Hypertension Maternal Grandfather   . Congestive Heart Failure Paternal Grandfather      Social History   Social History  . Marital status: Married    Spouse name: N/A  . Number of children: N/A  . Years of education: N/A   Occupational History  . Not on file.   Social History Main Topics  . Smoking status: Never Smoker  . Smokeless tobacco: Former Neurosurgeon    Types: Chew  . Alcohol use No  . Drug use: No  . Sexual activity: Yes    Partners: Female   Other Topics Concern  . Not on file   Social History Narrative  . No narrative on file     Current Outpatient Prescriptions:  .  levocetirizine (XYZAL) 5 MG tablet, Take 1 tablet (5 mg total) by mouth every evening., Disp: 90 tablet, Rfl: 1 .  montelukast (SINGULAIR) 10 MG tablet, Take 1 tablet (10 mg total) by mouth daily., Disp: 90 tablet, Rfl: 1 .  PROAIR HFA 108 (90 Base) MCG/ACT inhaler, INHALE TWO PUFFS BY MOUTH EVERY 6 HOURS AS NEEDED FOR WHEEZING OR  SHORTNESS  OF  BREATH, Disp: 9 each, Rfl: 2 .  budesonide-formoterol (SYMBICORT) 160-4.5 MCG/ACT inhaler, Inhale 2 puffs into the lungs 2 (two) times daily., Disp: 1 Inhaler, Rfl: 2 .  olmesartan-hydrochlorothiazide (BENICAR HCT) 40-25 MG tablet, Take 0.5-1 tablets by mouth daily., Disp: 30 tablet, Rfl: 0  Allergies  Allergen Reactions  . Aspirin   . Tetanus Toxoids      ROS  Constitutional:  Negative for fever, positive for mild  weight change.  Respiratory: Positive for cough but no  shortness of breath.   Cardiovascular: Negative for chest pain or palpitations.  Gastrointestinal: Negative for abdominal pain, no bowel changes.  Musculoskeletal: Negative for gait problem or joint swelling.  Skin: Negative for rash.  Neurological: Negative for dizziness or headache.  No other specific complaints in a complete review of systems (except as listed in HPI above).  Objective  Vitals:   05/28/16 0944  BP: (!) 138/98  Pulse: 94  Resp: 16  Temp: 98.3 F (36.8 C)  SpO2: 95%  Weight: 270 lb 6 oz (122.6 kg)  Height:  (1.702 m)    Body mass  index is 42.35 kg/m.  Physical Exam  Constitutional: Patient appears well-developed and well-nourished. Obese  No distress.  HEENT: head atraumatic, normocephalic, pupils equal and reactive to light, neck supple, throat within normal limits Cardiovascular: Normal rate, regular rhythm and normal heart sounds.  No murmur heard. No BLE edema. Pulmonary/Chest: Effort normal and breath sounds normal. No respiratory distress. Abdominal: Soft.  There is no tenderness. Increase in abdominal girst Psychiatric: Patient has a normal mood and affect. behavior is normal. Judgment and thought content normal.  PHQ2/9: Depression screen [facility] 2/9 11/24/2015 07/03/2015 03/12/2015 12/10/2014  Decreased Interest 0 0 0 0  Down, Depressed, Hopeless 0 0 0 0  PHQ - 2 Score 0 0 0 0    Fall Risk: Fall Risk  11/24/2015 07/03/2015 03/12/2015 12/10/2014  Falls in the past year? No No No Yes  Number falls in past yr: - - - 1  Injury with Fall? - - - Yes     Assessment & Plan  1. Essential hypertension  bp has been elevated at home also, we will add Benicar - olmesartan-hydrochlorothiazide (BENICAR HCT) 40-25 MG tablet; Take 0.5-1 tablets by mouth daily.  Dispense: 30 tablet; Refill: 0 - EKG normal   2. Obesity, Class III, BMI 40-49.9 (morbid obesity) (HCC)  Discussed GLP-1 agonist. He denies family history of thyroid cancer . He is willing to try medication if elevated insulin level. He will return for samples and find out what is covered by insurance - Insulin, fasting  3. Dyslipidemia  Previous high triglycerides, increase abdominal girth, no history of high glucose  4. Perennial allergic rhinitis with seasonal variation  - levocetirizine (XYZAL) 5 MG tablet; Take 1 tablet (5 mg total) by mouth every evening.  Dispense: 90 tablet; Refill: 1 - montelukast (SINGULAIR) 10 MG tablet; Take 1 tablet (10 mg total) by mouth daily.  Dispense: 90 tablet; Refill: 1  5. Moderate persistent asthma without  complication  Out of Dulera because of cost, we will try Symbicort since we have free vouchers - montelukast (SINGULAIR) 10 MG tablet; Take 1 tablet (10 mg total) by mouth daily.  Dispense: 90 tablet; Refill: 1 - budesonide-formoterol (SYMBICORT) 160-4.5 MCG/ACT inhaler; Inhale 2 puffs into the lungs 2 (two) times daily.  Dispense: 1 Inhaler; Refill: 2  6. Hypertriglyceridemia   7. Snoring  Discussed sleep study, but he wants to try losing weight first  8. Need for pneumococcal vaccination  - Pneumococcal conjugate vaccine 13-valent IM

## 2016-05-28 NOTE — Patient Instructions (Signed)
Ozempic Victoza Trulicity  GLP1 agonist

## 2016-05-28 NOTE — Addendum Note (Signed)
Addended by: Alba Cory F on: 05/28/2016 10:52 AM   Modules accepted: Orders

## 2016-05-31 LAB — INSULIN, FASTING: INSULIN FASTING, SERUM: 12.5 u[IU]/mL (ref 2.0–19.6)

## 2016-07-01 ENCOUNTER — Ambulatory Visit (INDEPENDENT_AMBULATORY_CARE_PROVIDER_SITE_OTHER): Payer: BLUE CROSS/BLUE SHIELD | Admitting: Family Medicine

## 2016-07-01 ENCOUNTER — Encounter: Payer: Self-pay | Admitting: Family Medicine

## 2016-07-01 VITALS — BP 118/82 | HR 102 | Temp 98.0°F | Resp 16 | Ht 67.0 in | Wt 264.8 lb

## 2016-07-01 DIAGNOSIS — E8881 Metabolic syndrome: Secondary | ICD-10-CM | POA: Diagnosis not present

## 2016-07-01 DIAGNOSIS — E781 Pure hyperglyceridemia: Secondary | ICD-10-CM | POA: Diagnosis not present

## 2016-07-01 DIAGNOSIS — I10 Essential (primary) hypertension: Secondary | ICD-10-CM | POA: Diagnosis not present

## 2016-07-01 MED ORDER — OLMESARTAN MEDOXOMIL-HCTZ 40-25 MG PO TABS: 0.5000 | ORAL_TABLET | Freq: Every day | ORAL | 1 refills | Status: DC

## 2016-07-01 MED ORDER — SEMAGLUTIDE(0.25 OR 0.5MG/DOS) 2 MG/1.5ML ~~LOC~~ SOPN: 0.2500 mg | PEN_INJECTOR | SUBCUTANEOUS | 1 refills | Status: DC

## 2016-07-01 NOTE — Patient Instructions (Signed)
Prediabetes Eating Plan Prediabetes-also called impaired glucose tolerance or impaired fasting glucose-is a condition that causes blood sugar (blood glucose) levels to be higher than normal. Following a healthy diet can help to keep prediabetes under control. It can also help to lower the risk of type 2 diabetes and heart disease, which are increased in people who have prediabetes. Along with regular exercise, a healthy diet:  Promotes weight loss.  Helps to control blood sugar levels.  Helps to improve the way that the body uses insulin.  What do I need to know about this eating plan?  Use the glycemic index (GI) to plan your meals. The index tells you how quickly a food will raise your blood sugar. Choose low-GI foods. These foods take a longer time to raise blood sugar.  Pay close attention to the amount of carbohydrates in the food that you eat. Carbohydrates increase blood sugar levels.  Keep track of how many calories you take in. Eating the right amount of calories will help you to achieve a healthy weight. Losing about 7 percent of your starting weight can help to prevent type 2 diabetes.  You may want to follow a Mediterranean diet. This diet includes a lot of vegetables, lean meats or fish, whole grains, fruits, and healthy oils and fats. What foods can I eat? Grains Whole grains, such as whole-wheat or whole-grain breads, crackers, cereals, and pasta. Unsweetened oatmeal. Bulgur. Barley. Quinoa. Brown rice. Corn or whole-wheat flour tortillas or taco shells. Vegetables Lettuce. Spinach. Peas. Beets. Cauliflower. Cabbage. Broccoli. Carrots. Tomatoes. Squash. Eggplant. Herbs. Peppers. Onions. Cucumbers. Brussels sprouts. Fruits Berries. Bananas. Apples. Oranges. Grapes. Papaya. Mango. Pomegranate. Kiwi. Grapefruit. Cherries. Meats and Other Protein Sources Seafood. Lean meats, such as chicken and turkey or lean cuts of pork and beef. Tofu. Eggs. Nuts. Beans. Dairy Low-fat or  fat-free dairy products, such as yogurt, cottage cheese, and cheese. Beverages Water. Tea. Coffee. Sugar-free or diet soda. Seltzer water. Milk. Milk alternatives, such as soy or almond milk. Condiments Mustard. Relish. Low-fat, low-sugar ketchup. Low-fat, low-sugar barbecue sauce. Low-fat or fat-free mayonnaise. Sweets and Desserts Sugar-free or low-fat pudding. Sugar-free or low-fat ice cream and other frozen treats. Fats and Oils Avocado. Walnuts. Olive oil. The items listed above may not be a complete list of recommended foods or beverages. Contact your dietitian for more options. What foods are not recommended? Grains Refined white flour and flour products, such as bread, pasta, snack foods, and cereals. Beverages Sweetened drinks, such as sweet iced tea and soda. Sweets and Desserts Baked goods, such as cake, cupcakes, pastries, cookies, and cheesecake. The items listed above may not be a complete list of foods and beverages to avoid. Contact your dietitian for more information. This information is not intended to replace advice given to you by your health care provider. Make sure you discuss any questions you have with your health care provider. Document Released: 06/18/2014 Document Revised: 07/10/2015 Document Reviewed: 02/27/2014 Elsevier Interactive Patient Education  2017 [employer]  

## 2016-07-01 NOTE — Progress Notes (Signed)
Name: Matthew FairyDanny Beidler   MRN: [id number]    DOB: [birth year]   Date:07/01/2016       Progress Note  Subjective  Chief Complaint  Chief Complaint  Patient presents with  . Follow-up    1 month F/U  . Hypertension    Added Benicar last visit, doing well with BP at home has been running 120/80's  . Obesity    Giving up sugar drinks and has lost 6 pounds since last visit.    HPI  HTN: bp is at goal, on Benicar HCTZ, no side effects. Denies chest pain, dizziness  or palpitation.   Obesity: he has lost a little over 5 lbs since last visit,  he switched from regular soda to only diet drinks. He has  also decreasing portion size . He is willing to try GLP-1 agonist.   Metabolic syndrome: elevated bp, elevated triglycerides, increase in abdominal girth and increase insulin resistance. He has polydipsia but not polyphagia.   Patient Active Problem List   Diagnosis Date Noted  . Hypertriglyceridemia 05/28/2016  . Hypertension 11/24/2015  . Perennial allergic rhinitis with seasonal variation 12/10/2014  . Asthma, moderate persistent 12/10/2014  . Dyslipidemia 12/10/2014  . Obesity, Class III, BMI 40-49.9 (morbid obesity) (HCC) 12/10/2014    Past Surgical History:  Procedure Laterality Date  . TONSILECTOMY/ADENOIDECTOMY WITH MYRINGOTOMY      Family History  Problem Relation Age of Onset  . Hyperlipidemia Mother   . Hypertension Mother   . Hypothyroidism Mother   . Hyperlipidemia Sister   . Hypertension Sister   . Asthma Sister   . CAD Maternal Grandfather   . Hypertension Maternal Grandfather   . Congestive Heart Failure Paternal Grandfather     Social History   Social History  . Marital status: Married    Spouse name: N/A  . Number of children: N/A  . Years of education: N/A   Occupational History  . Not on file.   Social History Main Topics  . Smoking status: Never Smoker  . Smokeless tobacco: Former NeurosurgeonUser    Types: Chew  . Alcohol use No  . Drug use: No  .  Sexual activity: Yes    Partners: Female   Other Topics Concern  . Not on file   Social History Narrative  . No narrative on file     Current Outpatient Prescriptions:  .  budesonide-formoterol (SYMBICORT) 160-4.5 MCG/ACT inhaler, Inhale 2 puffs into the lungs 2 (two) times daily., Disp: 1 Inhaler, Rfl: 2 .  levocetirizine (XYZAL) 5 MG tablet, Take 1 tablet (5 mg total) by mouth every evening., Disp: 90 tablet, Rfl: 1 .  montelukast (SINGULAIR) 10 MG tablet, Take 1 tablet (10 mg total) by mouth daily., Disp: 90 tablet, Rfl: 1 .  olmesartan-hydrochlorothiazide (BENICAR HCT) 40-25 MG tablet, Take 0.5-1 tablets by mouth daily., Disp: 30 tablet, Rfl: 1 .  PROAIR HFA 108 (90 Base) MCG/ACT inhaler, INHALE TWO PUFFS BY MOUTH EVERY 6 HOURS AS NEEDED FOR WHEEZING OR  SHORTNESS  OF  BREATH, Disp: 9 each, Rfl: 2 .  Semaglutide (OZEMPIC) 0.25 or 0.5 MG/DOSE SOPN, Inject 0.25-0.5 mg into the skin once a week., Disp: 2 pen, Rfl: 1  Allergies  Allergen Reactions  . Aspirin   . Tetanus Toxoids      ROS  Constitutional: Negative for fever, positive for  weight change - lost some.  Respiratory: Negative for cough and shortness of breath.   Cardiovascular: Negative for chest pain or palpitations.  Gastrointestinal: Negative  for abdominal pain, no bowel changes.  Musculoskeletal: Negative for gait problem or joint swelling.  Skin: Negative for rash.  Neurological: Negative for dizziness or headache.  No other specific complaints in a complete review of systems (except as listed in HPI above).  Objective  Vitals:   07/01/16 0936  BP: 118/82  Pulse: (!) 102  Resp: 16  Temp: 98 F (36.7 C)  TempSrc: Oral  SpO2: 94%  Weight: 264 lb 12.8 oz (120.1 kg)  Height: 5\' 7"  (1.702 m)    Body mass index is 41.47 kg/m.  Physical Exam  Constitutional: Patient appears well-developed and well-nourished. Obese  No distress.  HEENT: head atraumatic, normocephalic, pupils equal and reactive to  light,  neck supple, throat within normal limits Cardiovascular: Normal rate, regular rhythm and normal heart sounds.  No murmur heard. No BLE edema. Pulmonary/Chest: Effort normal and breath sounds normal. No respiratory distress. Abdominal: Soft.  There is no tenderness. Increase in abdominal girth  Psychiatric: Patient has a normal mood and affect. behavior is normal. Judgment and thought content normal.    Recent Results (from the past 2160 hour(s))  Insulin, fasting     Status: None   Collection Time: 05/28/16 11:02 AM  Result Value Ref Range   Insulin fasting, serum 12.5 2.0 - 19.6 uIU/mL    Comment:   This insulin assay shows strong cross-reactivity for some insulin analogs (lispro, aspart, and glargine) and much lower cross-reactivity with others (detemir, glulisine).   Stimulated Insulin reference intervals were established using the Siemens Immulite assay. These values are provided for general guidance only.      PHQ2/9: Depression screen [facility] 2/9 07/01/2016 11/24/2015 07/03/2015 03/12/2015 12/10/2014  Decreased Interest 0 0 0 0 0  Down, Depressed, Hopeless 0 0 0 0 0  PHQ - 2 Score 0 0 0 0 0     Fall Risk: Fall Risk  07/01/2016 11/24/2015 07/03/2015 03/12/2015 12/10/2014  Falls in the past year? No No No No Yes  Number falls in past yr: - - - - 1  Injury with Fall? - - - - Yes    Functional Status Survey: Is the patient deaf or have difficulty hearing?: No Does the patient have difficulty seeing, even when wearing glasses/contacts?: No Does the patient have difficulty concentrating, remembering, or making decisions?: No Does the patient have difficulty walking or climbing stairs?: No Does the patient have difficulty dressing or bathing?: No Does the patient have difficulty doing errands alone such as visiting a doctor's office or shopping?: No   Assessment & Plan  1. Essential hypertension  Doing well on medication  - olmesartan-hydrochlorothiazide (BENICAR HCT)  40-25 MG tablet; Take 0.5-1 tablets by mouth daily.  Dispense: 30 tablet; Refill: 1  2. Obesity, Class III, BMI 40-49.9 (morbid obesity) (HCC)  - Semaglutide (OZEMPIC) 0.25 or 0.5 MG/DOSE SOPN; Inject 0.25-0.5 mg into the skin once a week.  Dispense: 2 pen; Refill: 1  Discussed all optiosns for weight loss medications including Belviq, Qsymia, Saxenda and Contrave. Discussed risk and benefits of each of them.  3. Hypertriglyceridemia  Recheck labs next visit  4. Metabolic syndrome  - Semaglutide (OZEMPIC) 0.25 or 0.5 MG/DOSE SOPN; Inject 0.25-0.5 mg into the skin once a week.  Dispense: 2 pen; Refill: 1

## 2016-08-31 ENCOUNTER — Ambulatory Visit: Payer: BLUE CROSS/BLUE SHIELD | Admitting: Family Medicine

## 2016-09-21 ENCOUNTER — Encounter: Payer: Self-pay | Admitting: Family Medicine

## 2016-09-21 ENCOUNTER — Ambulatory Visit (INDEPENDENT_AMBULATORY_CARE_PROVIDER_SITE_OTHER): Payer: BLUE CROSS/BLUE SHIELD | Admitting: Family Medicine

## 2016-09-21 VITALS — BP 114/70 | HR 99 | Temp 98.2°F | Resp 16 | Ht 67.0 in | Wt 251.8 lb

## 2016-09-21 DIAGNOSIS — J4540 Moderate persistent asthma, uncomplicated: Secondary | ICD-10-CM | POA: Diagnosis not present

## 2016-09-21 DIAGNOSIS — E8881 Metabolic syndrome: Secondary | ICD-10-CM | POA: Diagnosis not present

## 2016-09-21 DIAGNOSIS — E781 Pure hyperglyceridemia: Secondary | ICD-10-CM | POA: Diagnosis not present

## 2016-09-21 DIAGNOSIS — L989 Disorder of the skin and subcutaneous tissue, unspecified: Secondary | ICD-10-CM

## 2016-09-21 DIAGNOSIS — I10 Essential (primary) hypertension: Secondary | ICD-10-CM

## 2016-09-21 DIAGNOSIS — E785 Hyperlipidemia, unspecified: Secondary | ICD-10-CM | POA: Diagnosis not present

## 2016-09-21 LAB — CBC WITH DIFFERENTIAL/PLATELET
BASOS PCT: 0 %
Basophils Absolute: 0 cells/uL (ref 0–200)
EOS PCT: 3 %
Eosinophils Absolute: 213 cells/uL (ref 15–500)
HCT: 46.8 % (ref 38.5–50.0)
Hemoglobin: 15.6 g/dL (ref 13.2–17.1)
LYMPHS PCT: 20 %
Lymphs Abs: 1420 cells/uL (ref 850–3900)
MCH: 30.4 pg (ref 27.0–33.0)
MCHC: 33.3 g/dL (ref 32.0–36.0)
MCV: 91.2 fL (ref 80.0–100.0)
MPV: 9.2 fL (ref 7.5–12.5)
Monocytes Absolute: 568 cells/uL (ref 200–950)
Monocytes Relative: 8 %
NEUTROS PCT: 69 %
Neutro Abs: 4899 cells/uL (ref 1500–7800)
PLATELETS: 330 10*3/uL (ref 140–400)
RBC: 5.13 MIL/uL (ref 4.20–5.80)
RDW: 13.4 % (ref 11.0–15.0)
WBC: 7.1 10*3/uL (ref 3.8–10.8)

## 2016-09-21 MED ORDER — BUDESONIDE-FORMOTEROL FUMARATE 160-4.5 MCG/ACT IN AERO: 2.0000 | INHALATION_SPRAY | Freq: Two times a day (BID) | RESPIRATORY_TRACT | 2 refills | Status: DC

## 2016-09-21 MED ORDER — OLMESARTAN MEDOXOMIL 20 MG PO TABS: 20.0000 mg | ORAL_TABLET | Freq: Every day | ORAL | 0 refills | Status: DC

## 2016-09-21 NOTE — Progress Notes (Signed)
Name: Matthew Barrett   MRN: [id number]    DOB: [birth year]   Date:09/21/2016       Progress Note  Subjective  Chief Complaint  Chief Complaint  Patient presents with  . Follow-up    2 month F/U  . Obesity    Doing well with Ozempic has lost 14 pounds since last visit. Having some nausea with medication    HPI   HTN: bp is at goal, on Benicar HCTZ, half pill daily and bp is still towards low end of normal.  Denies chest pain, dizziness  or palpitation.   Obesity: he has lost a little over 14 lbs since last visit,  he switched from regular soda to only diet drinks. He has  also decreasing portion size . He is now on Ozempic and is curbing his appetite , doing well on medication, has some nausea for a few days after using Ozempic.   Metabolic syndrome: elevated bp, elevated triglycerides, increase in abdominal girth and increase insulin resistance. He states he is doing much better after he started Ozempic, noticed decrease in appetite, and not having polyphagia or polydipsia.   Asthma Moderate: doing well on medication, denies any wheezing, SOB or chest tightness.    Patient Active Problem List   Diagnosis Date Noted  . Hypertriglyceridemia 05/28/2016  . Hypertension 11/24/2015  . Perennial allergic rhinitis with seasonal variation 12/10/2014  . Asthma, moderate persistent 12/10/2014  . Dyslipidemia 12/10/2014  . Obesity, Class III, BMI 40-49.9 (morbid obesity) (HCC) 12/10/2014    Past Surgical History:  Procedure Laterality Date  . TONSILECTOMY/ADENOIDECTOMY WITH MYRINGOTOMY      Family History  Problem Relation Age of Onset  . Hyperlipidemia Mother   . Hypertension Mother   . Hypothyroidism Mother   . Hyperlipidemia Sister   . Hypertension Sister   . Asthma Sister   . CAD Maternal Grandfather   . Hypertension Maternal Grandfather   . Congestive Heart Failure Paternal Grandfather     Social History   Social History  . Marital status: Married    Spouse name:  N/A  . Number of children: N/A  . Years of education: N/A   Occupational History  . Not on file.   Social History Main Topics  . Smoking status: Never Smoker  . Smokeless tobacco: Former NeurosurgeonUser    Types: Chew  . Alcohol use No  . Drug use: No  . Sexual activity: Yes    Partners: Female   Other Topics Concern  . Not on file   Social History Narrative  . No narrative on file     Current Outpatient Prescriptions:  .  budesonide-formoterol (SYMBICORT) 160-4.5 MCG/ACT inhaler, Inhale 2 puffs into the lungs 2 (two) times daily., Disp: 1 Inhaler, Rfl: 2 .  levocetirizine (XYZAL) 5 MG tablet, Take 1 tablet (5 mg total) by mouth every evening., Disp: 90 tablet, Rfl: 1 .  montelukast (SINGULAIR) 10 MG tablet, Take 1 tablet (10 mg total) by mouth daily., Disp: 90 tablet, Rfl: 1 .  olmesartan-hydrochlorothiazide (BENICAR HCT) 40-25 MG tablet, Take 0.5-1 tablets by mouth daily., Disp: 30 tablet, Rfl: 1 .  PROAIR HFA 108 (90 Base) MCG/ACT inhaler, INHALE TWO PUFFS BY MOUTH EVERY 6 HOURS AS NEEDED FOR WHEEZING OR  SHORTNESS  OF  BREATH, Disp: 9 each, Rfl: 2 .  Semaglutide (OZEMPIC) 0.25 or 0.5 MG/DOSE SOPN, Inject 0.25-0.5 mg into the skin once a week., Disp: 2 pen, Rfl: 1  Allergies  Allergen Reactions  . Aspirin   .  Tetanus Toxoids Other (See Comments)    Flared up Asthma     ROS  Constitutional: Negative for fever , positive for  weight change.  Respiratory: Negative for cough and shortness of breath.   Cardiovascular: Negative for chest pain or palpitations.  Gastrointestinal: Negative for abdominal pain, no bowel changes.  Musculoskeletal: Negative for gait problem or joint swelling.  Skin: Negative for rash.  Neurological: Negative for dizziness or headache.  No other specific complaints in a complete review of systems (except as listed in HPI above).  Objective  Vitals:   09/21/16 1102  BP: 114/70  Pulse: 99  Resp: 16  Temp: 98.2 F (36.8 C)  TempSrc: Oral  SpO2:  97%  Weight: 251 lb 12.8 oz (114.2 kg)  Height: 5\' 7"  (1.702 m)    Body mass index is 39.44 kg/m.  Physical Exam  Constitutional: Patient appears well-developed and well-nourished. Obese  No distress.  HEENT: head atraumatic, normocephalic, pupils equal and reactive to light,  neck supple, throat within normal limits Cardiovascular: Normal rate, regular rhythm and normal heart sounds.  No murmur heard. No BLE edema. Pulmonary/Chest: Effort normal and breath sounds normal. No respiratory distress. Abdominal: Soft.  There is no tenderness. Psychiatric: Patient has a normal mood and affect. behavior is normal. Judgment and thought content normal. Skin Lesion: left anterior chest, brown with while halo around, got sunburn when he went to the beach  PHQ2/9: Depression screen [facility] 2/9 09/21/2016 07/01/2016 11/24/2015 07/03/2015 03/12/2015  Decreased Interest 0 0 0 0 0  Down, Depressed, Hopeless 0 0 0 0 0  PHQ - 2 Score 0 0 0 0 0    Fall Risk: Fall Risk  09/21/2016 07/01/2016 11/24/2015 07/03/2015 03/12/2015  Falls in the past year? No No No No No  Number falls in past yr: - - - - -  Injury with Fall? - - - - -  Comment - - - - -     Functional Status Survey: Is the patient deaf or have difficulty hearing?: No Does the patient have difficulty seeing, even when wearing glasses/contacts?: No Does the patient have difficulty concentrating, remembering, or making decisions?: No Does the patient have difficulty walking or climbing stairs?: No Does the patient have difficulty dressing or bathing?: No Does the patient have difficulty doing errands alone such as visiting a doctor's office or shopping?: No    Assessment & Plan  1. Essential hypertension  bp is low, taking half of Benicar 40/25, we will dc fluids pill and monitor - olmesartan (BENICAR) 20 MG tablet; Take 1 tablet (20 mg total) by mouth daily.  Dispense: 90 tablet; Refill: 0 - olmesartan (BENICAR) 20 MG tablet; Take 1 tablet (20  mg total) by mouth daily.  Dispense: 90 tablet; Refill: 0 - COMPLETE METABOLIC PANEL WITH GFR - CBC with Differential/Platelet  2. Obesity, Class III, BMI 40-49.9 (morbid obesity) (HCC)  Losing weight with life style modification and Ozempic - Insulin, fasting  3. Hypertriglyceridemia  Check lipid panel   4. Metabolic syndrome  - Hemoglobin A1c - Insulin, fasting  5. Dyslipidemia  - Lipid panel  6. Moderate persistent asthma without complication  - budesonide-formoterol (SYMBICORT) 160-4.5 MCG/ACT inhaler; Inhale 2 puffs into the lungs 2 (two) times daily.  Dispense: 1 Inhaler; Refill: 2

## 2016-09-22 LAB — HEMOGLOBIN A1C
Hgb A1c MFr Bld: 5.1 % (ref <5.7)
Mean Plasma Glucose: 100 mg/dL

## 2016-09-22 LAB — INSULIN, FASTING: INSULIN FASTING, SERUM: 16.7 u[IU]/mL (ref 2.0–19.6)

## 2016-09-23 LAB — COMPLETE METABOLIC PANEL WITH GFR
ALBUMIN: 4.5 g/dL (ref 3.6–5.1)
ALK PHOS: 75 U/L (ref 40–115)
ALT: 25 U/L (ref 9–46)
AST: 21 U/L (ref 10–40)
BUN: 17 mg/dL (ref 7–25)
CALCIUM: 10 mg/dL (ref 8.6–10.3)
CHLORIDE: 105 mmol/L (ref 98–110)
CO2: 18 mmol/L — ABNORMAL LOW (ref 20–32)
CREATININE: 1.24 mg/dL (ref 0.60–1.35)
GFR, Est African American: 81 mL/min (ref >=60)
GFR, Est Non African American: 70 mL/min (ref >=60)
Glucose, Bld: 86 mg/dL (ref 65–99)
POTASSIUM: 5 mmol/L (ref 3.5–5.3)
Sodium: 143 mmol/L (ref 135–146)
Total Bilirubin: 0.3 mg/dL (ref 0.2–1.2)
Total Protein: 7.4 g/dL (ref 6.1–8.1)

## 2016-09-23 LAB — LIPID PANEL
CHOL/HDL RATIO: 3.3 ratio (ref <5.0)
Cholesterol: 163 mg/dL (ref <200)
HDL: 50 mg/dL (ref >40)
LDL Cholesterol: 95 mg/dL (ref <100)
Triglycerides: 92 mg/dL (ref <150)
VLDL: 18 mg/dL (ref <30)

## 2016-10-08 ENCOUNTER — Ambulatory Visit: Payer: BLUE CROSS/BLUE SHIELD | Admitting: Family Medicine

## 2016-10-18 ENCOUNTER — Other Ambulatory Visit: Payer: Self-pay | Admitting: Family Medicine

## 2016-10-18 DIAGNOSIS — E8881 Metabolic syndrome: Secondary | ICD-10-CM

## 2016-10-19 NOTE — Telephone Encounter (Signed)
Patient requesting refill of Ozempic to [facility].  

## 2016-12-22 ENCOUNTER — Encounter: Payer: Self-pay | Admitting: Family Medicine

## 2016-12-22 ENCOUNTER — Ambulatory Visit (INDEPENDENT_AMBULATORY_CARE_PROVIDER_SITE_OTHER): Payer: BLUE CROSS/BLUE SHIELD | Admitting: Family Medicine

## 2016-12-22 VITALS — BP 114/80 | HR 86 | Temp 98.0°F | Resp 16 | Ht 67.0 in | Wt 255.0 lb

## 2016-12-22 DIAGNOSIS — J4540 Moderate persistent asthma, uncomplicated: Secondary | ICD-10-CM | POA: Diagnosis not present

## 2016-12-22 DIAGNOSIS — Z23 Encounter for immunization: Secondary | ICD-10-CM | POA: Diagnosis not present

## 2016-12-22 DIAGNOSIS — J302 Other seasonal allergic rhinitis: Secondary | ICD-10-CM | POA: Diagnosis not present

## 2016-12-22 DIAGNOSIS — I10 Essential (primary) hypertension: Secondary | ICD-10-CM | POA: Diagnosis not present

## 2016-12-22 DIAGNOSIS — J3089 Other allergic rhinitis: Secondary | ICD-10-CM | POA: Diagnosis not present

## 2016-12-22 DIAGNOSIS — E8881 Metabolic syndrome: Secondary | ICD-10-CM

## 2016-12-22 LAB — POCT GLYCOSYLATED HEMOGLOBIN (HGB A1C): Hemoglobin A1C: 5.4

## 2016-12-22 MED ORDER — MONTELUKAST SODIUM 10 MG PO TABS: 10.0000 mg | ORAL_TABLET | Freq: Every day | ORAL | 1 refills | Status: DC

## 2016-12-22 MED ORDER — BUDESONIDE-FORMOTEROL FUMARATE 160-4.5 MCG/ACT IN AERO
2.0000 | INHALATION_SPRAY | Freq: Two times a day (BID) | RESPIRATORY_TRACT | 2 refills | Status: DC
Start: 2016-12-22 — End: 2017-04-11

## 2016-12-22 MED ORDER — OLMESARTAN MEDOXOMIL 20 MG PO TABS: 20.0000 mg | ORAL_TABLET | Freq: Every day | ORAL | 0 refills | Status: DC

## 2016-12-22 NOTE — Progress Notes (Signed)
Name: Matthew Barrett   MRN: [id number]    DOB: [birth year]   Date:12/22/2016       Progress Note  Subjective  Chief Complaint  Chief Complaint  Patient presents with  . Medication Refill  . Hypertension  . Obesity  . Metabolic Syndrome  . Asthma    HPI  HTN: bp is at goal, on Benicar, off HCTZ.  Denies chest pain, dizziness or palpitation.   Obesity: he had  lost a little over 14 lbs in 3 months after started on Ozempic 06/2016, but he has been out of medication for the past month because of cost. He switched from regular soda to only diet drinks. He has also decreasing portion size. He has noticed appetite is increasing again since off Ozempic, gained 4 lbs over the past month.   Metabolic syndrome: elevated bp, elevated triglycerides, increase in abdominal girth and increase insulin resistance. He states he was doing much better after he started Ozempic, noticed decrease in appetite, and not having polyphagia or polydipsia. However since off medication feeling very hungry again  Asthma Moderate: doing well on medication, very seldom has a dry cough or  wheezing, denies SOB or chest tightness.    Patient Active Problem List   Diagnosis Date Noted  . Hypertriglyceridemia 05/28/2016  . Hypertension 11/24/2015  . Perennial allergic rhinitis with seasonal variation 12/10/2014  . Asthma, moderate persistent 12/10/2014  . Dyslipidemia 12/10/2014  . Obesity, Class III, BMI 40-49.9 (morbid obesity) (HCC) 12/10/2014    Past Surgical History:  Procedure Laterality Date  . TONSILECTOMY/ADENOIDECTOMY WITH MYRINGOTOMY      Family History  Problem Relation Age of Onset  . Hyperlipidemia Mother   . Hypertension Mother   . Hypothyroidism Mother   . Hyperlipidemia Sister   . Hypertension Sister   . Asthma Sister   . CAD Maternal Grandfather   . Hypertension Maternal Grandfather   . Congestive Heart Failure Paternal Grandfather     Social History   Socioeconomic History   . Marital status: Married    Spouse name: Not on file  . Number of children: Not on file  . Years of education: Not on file  . Highest education level: Not on file  Social Needs  . Financial resource strain: Not on file  . Food insecurity - worry: Not on file  . Food insecurity - inability: Not on file  . Transportation needs - medical: Not on file  . Transportation needs - non-medical: Not on file  Occupational History  . Not on file  Tobacco Use  . Smoking status: Never Smoker  . Smokeless tobacco: Former Neurosurgeon    Types: Chew  Substance and Sexual Activity  . Alcohol use: No    Alcohol/week: 0.0 oz  . Drug use: No  . Sexual activity: Yes    Partners: Female  Other Topics Concern  . Not on file  Social History Narrative  . Not on file     Current Outpatient Medications:  .  budesonide-formoterol (SYMBICORT) 160-4.5 MCG/ACT inhaler, Inhale 2 puffs into the lungs 2 (two) times daily., Disp: 1 Inhaler, Rfl: 2 .  levocetirizine (XYZAL) 5 MG tablet, Take 1 tablet (5 mg total) by mouth every evening., Disp: 90 tablet, Rfl: 1 .  montelukast (SINGULAIR) 10 MG tablet, Take 1 tablet (10 mg total) by mouth daily., Disp: 90 tablet, Rfl: 1 .  olmesartan (BENICAR) 20 MG tablet, Take 1 tablet (20 mg total) by mouth daily., Disp: 90 tablet, Rfl: 0 .  PROAIR HFA 108 (90 Base) MCG/ACT inhaler, INHALE TWO PUFFS BY MOUTH EVERY 6 HOURS AS NEEDED FOR WHEEZING OR  SHORTNESS  OF  BREATH, Disp: 9 each, Rfl: 2 .  Semaglutide (OZEMPIC) 0.25 or 0.5 MG/DOSE SOPN, Inject 0.5 mg into the skin once a week. (Patient not taking: Reported on 12/22/2016), Disp: 3 mL, Rfl: 1  Allergies  Allergen Reactions  . Aspirin   . Tetanus Toxoids Other (See Comments)    Flared up Asthma     ROS  Constitutional: Negative for fever or significant  weight change.  Respiratory:Positive for intermittent  cough and shortness of breath.   Cardiovascular: Negative for chest pain or palpitations.  Gastrointestinal:  Negative for abdominal pain, no bowel changes.  Musculoskeletal: Negative for gait problem or joint swelling.  Skin: Negative for rash.  Neurological: Negative for dizziness or headache.  No other specific complaints in a complete review of systems (except as listed in HPI above).  Objective  Vitals:   12/22/16 1045  BP: 114/80  Pulse: 86  Resp: 16  Temp: 98 F (36.7 C)  TempSrc: Oral  SpO2: 97%  Weight: 255 lb (115.7 kg)  Height: 5\' 7"  (1.702 m)    Body mass index is 39.94 kg/m.  Physical Exam  Constitutional: Patient appears well-developed and well-nourished. Obese  No distress.  HEENT: head atraumatic, normocephalic, pupils equal and reactive to light,  neck supple, throat within normal limits Cardiovascular: Normal rate, regular rhythm and normal heart sounds.  No murmur heard. No BLE edema. Pulmonary/Chest: Effort normal and breath sounds normal. No respiratory distress. Abdominal: Soft.  There is no tenderness. Psychiatric: Patient has a normal mood and affect. behavior is normal. Judgment and thought content normal.  Recent Results (from the past 2160 hour(s))  POCT HgB A1C     Status: Normal   Collection Time: 12/22/16 10:51 AM  Result Value Ref Range   Hemoglobin A1C 5.4      PHQ2/9: [facility] 2[facility] 12/22/2016 09/21/2016 07/01/2016 11/24/2015 07/03/2015  Decreased Interest 0 0 0 0 0  Down, Depressed, Hopeless 0 0 0 0 0  PHQ - 2 Score 0 0 0 0 0    Fall Risk: Fall Risk  12/22/2016 09/21/2016 07/01/2016 11/24/2015 07/03/2015  Falls in the past year? No No No No No  Number falls in past yr: - - - - -  Injury with Fall? - - - - -  Comment - - - - -    Functional Status Survey: Is the patient deaf or have difficulty hearing?: No Does the patient have difficulty seeing, even when wearing glasses/contacts?: No Does the patient have difficulty concentrating, remembering, or making decisions?: No Does the patient have difficulty walking or climbing stairs?:  No Does the patient have difficulty dressing or bathing?: No Does the patient have difficulty doing errands alone such as visiting a doctor's office or shopping?: No    Assessment & Plan  1. Metabolic syndrome  - POCT HgB [id number]  2. Obesity, Class III, BMI 40-49.9 (morbid obesity) (HCC)  - POCT HgB A1C  3. Need for immunization against influenza  - Flu Vaccine QUAD 6+ mos PF IM (Fluarix Quad PF)  4. Essential hypertension  - olmesartan (BENICAR) 20 MG tablet; Take 1 tablet (20 mg total) daily by mouth.  Dispense: 90 tablet; Refill: 0  5. Perennial allergic rhinitis with seasonal variation  - montelukast (SINGULAIR) 10 MG tablet; Take 1 tablet (10 mg total) daily by mouth.  Dispense: 90 tablet;  Refill: 1  6. Moderate persistent asthma without complication  - montelukast (SINGULAIR) 10 MG tablet; Take 1 tablet (10 mg total) daily by mouth.  Dispense: 90 tablet; Refill: 1 - budesonide-formoterol (SYMBICORT) 160-4.5 MCG/ACT inhaler; Inhale 2 puffs 2 (two) times daily into the lungs.  Dispense: 1 Inhaler; Refill: 2

## 2017-02-10 ENCOUNTER — Encounter: Payer: Self-pay | Admitting: Family Medicine

## 2017-02-10 ENCOUNTER — Ambulatory Visit (INDEPENDENT_AMBULATORY_CARE_PROVIDER_SITE_OTHER): Payer: BLUE CROSS/BLUE SHIELD | Admitting: Family Medicine

## 2017-02-10 VITALS — BP 108/80 | HR 101 | Temp 99.1°F | Resp 14 | Ht 67.0 in | Wt 254.1 lb

## 2017-02-10 DIAGNOSIS — J4540 Moderate persistent asthma, uncomplicated: Secondary | ICD-10-CM | POA: Diagnosis not present

## 2017-02-10 DIAGNOSIS — J069 Acute upper respiratory infection, unspecified: Secondary | ICD-10-CM | POA: Diagnosis not present

## 2017-02-10 MED ORDER — FIRST-DUKES MOUTHWASH MT SUSP: 5.0000 mL | Freq: Three times a day (TID) | OROMUCOSAL | 0 refills | Status: DC | PRN

## 2017-02-10 MED ORDER — FLUTICASONE PROPIONATE 50 MCG/ACT NA SUSP: 2.0000 | Freq: Every day | NASAL | 0 refills | Status: DC

## 2017-02-10 NOTE — Progress Notes (Signed)
Name: Matthew Barrett   MRN: [id number]    DOB: 03-Mar-1970   Date:02/10/2017       Progress Note  Subjective  Chief Complaint  Chief Complaint  Patient presents with  . Sore Throat  . Cough  . Generalized Body Aches    HPI  Patient presents with 2 days of chills, fever (highest was 100.39F at home - afebrile in office today), body aches, sore throat, nasal congestion (states this is chronic for him), mild intermittent cough.  Took motrin and alka-seltzer last night with some relief.  Denies chest pain or shortness of breath, headaches, NVD, abdominal pain.  PT has hx allergic rhinitis and moderate persistent asthma - taking Xyzal, singulair, Symbicort daily; taking Albuterol inhaler as needed (has not needed for current illness)  Patient Active Problem List   Diagnosis Date Noted  . Hypertriglyceridemia 05/28/2016  . Hypertension 11/24/2015  . Perennial allergic rhinitis with seasonal variation 12/10/2014  . Asthma, moderate persistent 12/10/2014  . Dyslipidemia 12/10/2014  . Obesity, Class III, BMI 40-49.9 (morbid obesity) (HCC) 12/10/2014    Social History   Tobacco Use  . Smoking status: Never Smoker  . Smokeless tobacco: Former NeurosurgeonUser    Types: Chew  Substance Use Topics  . Alcohol use: No    Alcohol/week: 0.0 oz     Current Outpatient Medications:  .  budesonide-formoterol (SYMBICORT) 160-4.5 MCG/ACT inhaler, Inhale 2 puffs 2 (two) times daily into the lungs., Disp: 1 Inhaler, Rfl: 2 .  levocetirizine (XYZAL) 5 MG tablet, Take 1 tablet (5 mg total) by mouth every evening., Disp: 90 tablet, Rfl: 1 .  montelukast (SINGULAIR) 10 MG tablet, Take 1 tablet (10 mg total) daily by mouth., Disp: 90 tablet, Rfl: 1 .  olmesartan (BENICAR) 20 MG tablet, Take 1 tablet (20 mg total) daily by mouth., Disp: 90 tablet, Rfl: 0 .  PROAIR HFA 108 (90 Base) MCG/ACT inhaler, INHALE TWO PUFFS BY MOUTH EVERY 6 HOURS AS NEEDED FOR WHEEZING OR  SHORTNESS  OF  BREATH, Disp: 9 each, Rfl: 2 .   Semaglutide (OZEMPIC) 0.25 or 0.5 MG/DOSE SOPN, Inject 0.5 mg into the skin once a week. (Patient not taking: Reported on 12/22/2016), Disp: 3 mL, Rfl: 1  Allergies  Allergen Reactions  . Aspirin   . Tetanus Toxoids Other (See Comments)    Flared up Asthma    ROS  Ten systems reviewed and is negative except as mentioned in HPI  Objective  Vitals:   02/10/17 0919  BP: 108/80  Pulse: (!) 101  Resp: 14  Temp: 99.1 F (37.3 C)  TempSrc: Oral  SpO2: 98%  Weight: 254 lb 1.6 oz (115.3 kg)  Height: 5\' 7"  (1.702 m)   Body mass index is 39.8 kg/m.  Nursing Note and Vital Signs reviewed.  Physical Exam  Constitutional: Patient appears well-developed and well-nourished. Obese. No distress.  HEENT: head atraumatic, normocephalic, pupils equal and reactive to light, EOM's intact, TM's without erythema or bulging, no maxillary or frontal sinus pain on palpation, nasal turbinates are inflamed bilaterally, neck supple without lymphadenopathy, oropharynx erythematous and moist without exudate Cardiovascular: Normal rate, regular rhythm, S1/S2 present.  No murmur or rub heard. No BLE edema. Pulmonary/Chest: Effort normal and breath sounds clear. No respiratory distress or retractions. Psychiatric: Patient has a normal mood and affect. behavior is normal. Judgment and thought content normal.  Recent Results (from the past 2160 hour(s))  POCT HgB A1C     Status: Normal   Collection Time: 12/22/16 10:51 AM  Result Value Ref Range   Hemoglobin A1C 5.4    Assessment & Plan  1. Upper respiratory tract infection, unspecified type - fluticasone (FLONASE) 50 MCG/ACT nasal spray; Place 2 sprays into both nostrils daily.  Dispense: 16 g; Refill: 0 - Diphenhyd-Hydrocort-Nystatin (FIRST-DUKES MOUTHWASH) SUSP; Use as directed 5 mLs in the mouth or throat 3 (three) times daily as needed. Gargle and spit  Dispense: 237 mL; Refill: 0 - Mucinex OTC.  Tylenol or Ibuprofen as needed for fevers/chills or  for pain.  Check labels of over-the-counter medications - do NOT exceed 3000mg  of Acetaminophen (Tylenol) in 24 hours.  Also check labels to ensure medication is compatbile with hypertension (high blood pressure)  2. Moderate persistent asthma without complication Continue daily medications as prescribed.  - Work note for today and tomorrow - back 02/12/2017. - Return if symptoms worsen or fail to improve, for 5-7 days. -Red flags and when to present for emergency care or RTC including fever >101.59F, chest pain, shortness of breath unrelieved by albuterol inhaler, new/worsening/un-resolving symptoms, reviewed with patient at time of visit. Follow up and care instructions discussed and provided in AVS.

## 2017-02-10 NOTE — Patient Instructions (Addendum)
Mucinex OTC.  Tylenol or Ibuprofen as needed for fevers/chills or for pain.  Check labels of over-the-counter medications - do NOT exceed 3000mg  of Acetaminophen (Tylenol) in 24 hours.  Also check labels to ensure medication is compatbile with hypertension (high blood pressure)  Upper Respiratory Infection, Adult Most upper respiratory infections (URIs) are caused by a virus. A URI affects the nose, throat, and upper air passages. The most common type of URI is often called "the common cold." Follow these instructions at home:  Take medicines only as told by your doctor.  Gargle warm saltwater or take cough drops to comfort your throat as told by your doctor.  Use a warm mist humidifier or inhale steam from a shower to increase air moisture. This may make it easier to breathe.  Drink enough fluid to keep your pee (urine) clear or pale yellow.  Eat soups and other clear broths.  Have a healthy diet.  Rest as needed.  Go back to work when your fever is gone or your doctor says it is okay. ? You may need to stay home longer to avoid giving your URI to others. ? You can also wear a face mask and wash your hands often to prevent spread of the virus.  Use your inhaler more if you have asthma.  Do not use any tobacco products, including cigarettes, chewing tobacco, or electronic cigarettes. If you need help quitting, ask your doctor. Contact a doctor if:  You are getting worse, not better.  Your symptoms are not helped by medicine.  You have chills.  You are getting more short of breath.  You have brown or red mucus.  You have yellow or brown discharge from your nose.  You have pain in your face, especially when you bend forward.  You have a fever.  You have puffy (swollen) neck glands.  You have pain while swallowing.  You have white areas in the back of your throat. Get help right away if:  You have very bad or constant: ? Headache. ? Ear pain. ? Pain in your  forehead, behind your eyes, and over your cheekbones (sinus pain). ? Chest pain.  You have long-lasting (chronic) lung disease and any of the following: ? Wheezing. ? Long-lasting cough. ? Coughing up blood. ? A change in your usual mucus.  You have a stiff neck.  You have changes in your: ? Vision. ? Hearing. ? Thinking. ? Mood. This information is not intended to replace advice given to you by your health care provider. Make sure you discuss any questions you have with your health care provider. Document Released: 07/21/2007 Document Revised: 10/05/2015 Document Reviewed: 05/09/2013 Elsevier Interactive Patient Education  2018 [employer]  [employer] Mist Vaporizer A cool mist vaporizer is a device that releases a cool mist into the air. If you have a cough or a cold, using a vaporizer may help relieve your symptoms. The mist adds moisture to the air, which may help thin your mucus and make it less sticky. When your mucus is thin and less sticky, it easier for you to breathe and to cough up secretions. Do not use a vaporizer if you are allergic to mold. Follow these instructions at home:  Follow the instructions that come with the vaporizer.  Do not use anything other than distilled water in the vaporizer.  Do not run the vaporizer all of the time. Doing that can cause mold or bacteria to grow in the vaporizer.  Clean the vaporizer after  each time that you use it.  Clean and dry the vaporizer well before storing it.  Stop using the vaporizer if your breathing symptoms get worse. This information is not intended to replace advice given to you by your health care provider. Make sure you discuss any questions you have with your health care provider. Document Released: 10/30/2003 Document Revised: 08/22/2015 Document Reviewed: 05/03/2015 Elsevier Interactive Patient Education  [employer]2018 [employer]vier Inc.

## 2017-03-24 ENCOUNTER — Ambulatory Visit: Payer: BLUE CROSS/BLUE SHIELD | Admitting: Family Medicine

## 2017-04-11 ENCOUNTER — Ambulatory Visit (INDEPENDENT_AMBULATORY_CARE_PROVIDER_SITE_OTHER): Payer: BLUE CROSS/BLUE SHIELD | Admitting: Family Medicine

## 2017-04-11 ENCOUNTER — Encounter: Payer: Self-pay | Admitting: Family Medicine

## 2017-04-11 VITALS — BP 128/82 | HR 104 | Temp 97.8°F | Resp 18 | Ht 67.0 in | Wt 259.3 lb

## 2017-04-11 DIAGNOSIS — J3089 Other allergic rhinitis: Secondary | ICD-10-CM

## 2017-04-11 DIAGNOSIS — E785 Hyperlipidemia, unspecified: Secondary | ICD-10-CM

## 2017-04-11 DIAGNOSIS — J4540 Moderate persistent asthma, uncomplicated: Secondary | ICD-10-CM | POA: Diagnosis not present

## 2017-04-11 DIAGNOSIS — R05 Cough: Secondary | ICD-10-CM | POA: Diagnosis not present

## 2017-04-11 DIAGNOSIS — R059 Cough, unspecified: Secondary | ICD-10-CM

## 2017-04-11 DIAGNOSIS — J302 Other seasonal allergic rhinitis: Secondary | ICD-10-CM | POA: Diagnosis not present

## 2017-04-11 DIAGNOSIS — I10 Essential (primary) hypertension: Secondary | ICD-10-CM | POA: Diagnosis not present

## 2017-04-11 DIAGNOSIS — J4541 Moderate persistent asthma with (acute) exacerbation: Secondary | ICD-10-CM | POA: Diagnosis not present

## 2017-04-11 DIAGNOSIS — E8881 Metabolic syndrome: Secondary | ICD-10-CM

## 2017-04-11 MED ORDER — BENZONATATE 200 MG PO CAPS: 200.0000 mg | ORAL_CAPSULE | Freq: Three times a day (TID) | ORAL | 0 refills | Status: DC | PRN

## 2017-04-11 MED ORDER — OLMESARTAN MEDOXOMIL 20 MG PO TABS: 20.0000 mg | ORAL_TABLET | Freq: Every day | ORAL | 1 refills | Status: DC

## 2017-04-11 MED ORDER — SEMAGLUTIDE(0.25 OR 0.5MG/DOS) 2 MG/1.5ML ~~LOC~~ SOPN: 0.5000 mg | PEN_INJECTOR | SUBCUTANEOUS | 1 refills | Status: DC

## 2017-04-11 MED ORDER — ALBUTEROL SULFATE HFA 108 (90 BASE) MCG/ACT IN AERS: INHALATION_SPRAY | RESPIRATORY_TRACT | 1 refills | Status: DC

## 2017-04-11 MED ORDER — PREDNISONE 20 MG PO TABS: 20.0000 mg | ORAL_TABLET | Freq: Two times a day (BID) | ORAL | 0 refills | Status: DC

## 2017-04-11 MED ORDER — BUDESONIDE-FORMOTEROL FUMARATE 160-4.5 MCG/ACT IN AERO: 2.0000 | INHALATION_SPRAY | Freq: Two times a day (BID) | RESPIRATORY_TRACT | 2 refills | Status: DC

## 2017-04-11 NOTE — Progress Notes (Signed)
Name: Matthew Barrett   MRN: [id number]    DOB: 1970/11/09   Date:04/11/2017       Progress Note  Subjective  Chief Complaint  Chief Complaint  Patient presents with  . Medication Refill    3 month F/U  . Metabolic Syndrome    Needs a new prescription-wrote wife a new one and price went back down.  Marland Kitchen Hypertension    Denis any symptoms  . Asthma    Coughing and activates wheezing every so often  . Obesity    Has gained 5 pounds since last visit  . Cough    Onset-3 and half weeks, Hacking cough comes and goes- cold symptoms-nagging cough, congested. Has not been able to shake symptoms off.     HPI   Asthma /URI:  Pt endorses dry cough, nasal congestion and occasional wheezing over the past 2 months. Denies fever, chills. Pt has been taking Symbicort, delsym and OTC xyzal with moderate relief of symptoms. Cough still is present and annoying . He was seen back in December and was given mouthwash for sore throat and and asthma control, symptoms resolved for almost 3 weeks but re-started a couple of weeks ago. He states the cough triggers some SOB. Clear rhinorrhea, occasionally has a productive cough that is clear, usually it is a dry cough.   HTN: bp is at goal, on Benicar, off HCTZ. Denies chest pain, dizziness or palpitation. BP is at goal today. His bp at home 130's/80's  Obesity: he had  lost a little over14lbs in 3 months after started on Ozempic 06/2016, but he has been out of medication for the past month because of cost, he is willing to try it again, states his wife went to pharmacy and rx was filled with a lower co-pay in 2019. He switched from regular soda to only diet drinks. He has not been walking this time of the year. Trying to avoid fried food, eating more nuts and vegetables.   Metabolic syndrome: elevated bp, elevated triglycerides, increase in abdominal girth and increase insulin resistance. Hestates he was doing much better after he started Ozempic, noticed  decrease in appetite, and not having polyphagia or polydipsia. However since off medication feeling very hungry again, gained weight back, however still 5 lbs lighter than he was back in May 2018    Patient Active Problem List   Diagnosis Date Noted  . Hypertriglyceridemia 05/28/2016  . Hypertension 11/24/2015  . Perennial allergic rhinitis with seasonal variation 12/10/2014  . Asthma, moderate persistent 12/10/2014  . Dyslipidemia 12/10/2014  . Obesity, Class III, BMI 40-49.9 (morbid obesity) (HCC) 12/10/2014    Past Surgical History:  Procedure Laterality Date  . TONSILECTOMY/ADENOIDECTOMY WITH MYRINGOTOMY      Family History  Problem Relation Age of Onset  . Hyperlipidemia Mother   . Hypertension Mother   . Hypothyroidism Mother   . Hyperlipidemia Sister   . Hypertension Sister   . Asthma Sister   . CAD Maternal Grandfather   . Hypertension Maternal Grandfather   . Congestive Heart Failure Paternal Grandfather     Social History   Socioeconomic History  . Marital status: Married    Spouse name: Not on file  . Number of children: Not on file  . Years of education: Not on file  . Highest education level: Not on file  Social Needs  . Financial resource strain: Not on file  . Food insecurity - worry: Not on file  . Food insecurity - inability: Not on  file  . Transportation needs - medical: Not on file  . Transportation needs - non-medical: Not on file  Occupational History  . Not on file  Tobacco Use  . Smoking status: Never Smoker  . Smokeless tobacco: Former NeurosurgeonUser    Types: Chew  Substance and Sexual Activity  . Alcohol use: No    Alcohol/week: 0.0 oz  . Drug use: No  . Sexual activity: Yes    Partners: Female  Other Topics Concern  . Not on file  Social History Narrative  . Not on file     Current Outpatient Medications:  .  albuterol (PROAIR HFA) 108 (90 Base) MCG/ACT inhaler, INHALE TWO PUFFS BY MOUTH EVERY 6 HOURS AS NEEDED FOR WHEEZING OR   SHORTNESS  OF  BREATH, Disp: 9 each, Rfl: 1 .  azelastine (ASTELIN) 0.1 % nasal spray, Place 1 spray into the nose as needed., Disp: , Rfl:  .  budesonide-formoterol (SYMBICORT) 160-4.5 MCG/ACT inhaler, Inhale 2 puffs into the lungs 2 (two) times daily., Disp: 1 Inhaler, Rfl: 2 .  fluticasone (FLONASE) 50 MCG/ACT nasal spray, Place 2 sprays into both nostrils daily., Disp: 16 g, Rfl: 0 .  levocetirizine (XYZAL) 5 MG tablet, Take 1 tablet (5 mg total) by mouth every evening., Disp: 90 tablet, Rfl: 1 .  montelukast (SINGULAIR) 10 MG tablet, Take 1 tablet (10 mg total) daily by mouth., Disp: 90 tablet, Rfl: 1 .  Semaglutide (OZEMPIC) 0.25 or 0.5 MG/DOSE SOPN, Inject 0.5 mg into the skin once a week., Disp: 3 mL, Rfl: 1 .  benzonatate (TESSALON) 200 MG capsule, Take 1 capsule (200 mg total) by mouth 3 (three) times daily as needed., Disp: 40 capsule, Rfl: 0 .  olmesartan (BENICAR) 20 MG tablet, Take 1 tablet (20 mg total) by mouth daily., Disp: 90 tablet, Rfl: 1  Allergies  Allergen Reactions  . Aspirin   . Tetanus Toxoids Other (See Comments)    Flared up Asthma     ROS  Constitutional: Negative for fever or weight change.  Respiratory:Positive for cough and shortness of breath.   Cardiovascular: Negative for chest pain or palpitations.  Gastrointestinal: Negative for abdominal pain, no bowel changes.  Musculoskeletal: Negative for gait problem or joint swelling.  Skin: Negative for rash.  Neurological: Negative for dizziness or headache.  No other specific complaints in a complete review of systems (except as listed in HPI above).  Objective  Vitals:   04/11/17 1457  BP: 128/82  Pulse: (!) 104  Resp: 18  Temp: 97.8 F (36.6 C)  TempSrc: Oral  SpO2: 98%  Weight: 259 lb 4.8 oz (117.6 kg)  Height: 5\' 7"  (1.702 m)    Body mass index is 40.61 kg/m.  Physical Exam  Constitutional: Patient appears well-developed and well-nourished. Obese  No distress.  HEENT: head atraumatic,  normocephalic, pupils equal and reactive to light, ears normal TM bilaterally, boggy turbinates, clear rhinorrhea,  neck supple, throat within normal limits Cardiovascular: Elevated  rate, regular rhythm and normal heart sounds.  No murmur heard. No BLE edema. Pulmonary/Chest: Effort normal a, rhonchi on bases with some expiratory wheezing.  No respiratory distress. Abdominal: Soft.  There is no tenderness. Psychiatric: Patient has a normal mood and affect. behavior is normal. Judgment and thought content normal.  PHQ2/9: Depression screen [facility] 2/[facility] 04/11/2017 12/22/2016 09/21/2016 07/01/2016 11/24/2015  Decreased Interest 0 0 0 0 0  Down, Depressed, Hopeless 0 0 0 0 0  PHQ - 2 Score 0 0 0 0 0  Fall Risk: Fall Risk  04/11/2017 02/10/2017 12/22/2016 09/21/2016 07/01/2016  Falls in the past year? No No No No No  Number falls in past yr: - - - - -  Injury with Fall? - - - - -  Comment - - - - -     Functional Status Survey: Is the patient deaf or have difficulty hearing?: No Does the patient have difficulty seeing, even when wearing glasses/contacts?: No Does the patient have difficulty concentrating, remembering, or making decisions?: No Does the patient have difficulty walking or climbing stairs?: No Does the patient have difficulty dressing or bathing?: No Does the patient have difficulty doing errands alone such as visiting a doctor's office or shopping?: No   Assessment & Plan  1. Essential hypertension  - olmesartan (BENICAR) 20 MG tablet; Take 1 tablet (20 mg total) by mouth daily.  Dispense: 90 tablet; Refill: 1  2. Metabolic syndrome  - Semaglutide (OZEMPIC) 0.25 or 0.5 MG/DOSE SOPN; Inject 0.5 mg into the skin once a week.  Dispense: 3 mL; Refill: 1  3. Moderate persistent asthma with acute exacerbation  Continue inhalers, add prednisone short course with food, discussed possible side effects, continue otc medication and add benzonatate  - benzonatate (TESSALON) 200 MG  capsule; Take 1 capsule (200 mg total) by mouth 3 (three) times daily as needed.  Dispense: 40 capsule; Refill: 0  - predniSONE (DELTASONE) 20 MG tablet; Take 1 tablet (20 mg total) by mouth 2 (two) times daily with a meal.  Dispense: 10 tablet; Refill: 0   4. Obesity, Class III, BMI 40-49.9 (morbid obesity) (HCC)  - Semaglutide (OZEMPIC) 0.25 or 0.5 MG/DOSE SOPN; Inject 0.5 mg into the skin once a week.  Dispense: 3 mL; Refill: 1  5. Perennial allergic rhinitis with seasonal variation  Needs to resume nasal spray   6. Dyslipidemia  Continue life style modification   7. Moderate persistent asthma without complication  - budesonide-formoterol (SYMBICORT) 160-4.5 MCG/ACT inhaler; Inhale 2 puffs into the lungs 2 (two) times daily.  Dispense: 1 Inhaler; Refill: 2 - albuterol (PROAIR HFA) 108 (90 Base) MCG/ACT inhaler; INHALE TWO PUFFS BY MOUTH EVERY 6 HOURS AS NEEDED FOR WHEEZING OR  SHORTNESS  OF  BREATH  Dispense: 9 each; Refill: 1 - benzonatate (TESSALON) 200 MG capsule; Take 1 capsule (200 mg total) by mouth 3 (three) times daily as needed.  Dispense: 40 capsule; Refill: 0  8. Cough  - benzonatate (TESSALON) 200 MG capsule; Take 1 capsule (200 mg total) by mouth 3 (three) times daily as needed.  Dispense: 40 capsule; Refill: 0

## 2017-07-12 ENCOUNTER — Ambulatory Visit: Payer: BLUE CROSS/BLUE SHIELD | Admitting: Family Medicine

## 2017-08-08 ENCOUNTER — Ambulatory Visit (INDEPENDENT_AMBULATORY_CARE_PROVIDER_SITE_OTHER): Payer: BLUE CROSS/BLUE SHIELD | Admitting: Family Medicine

## 2017-08-08 ENCOUNTER — Other Ambulatory Visit: Payer: Self-pay | Admitting: Family Medicine

## 2017-08-08 ENCOUNTER — Encounter: Payer: Self-pay | Admitting: Family Medicine

## 2017-08-08 DIAGNOSIS — E8881 Metabolic syndrome: Secondary | ICD-10-CM

## 2017-08-08 DIAGNOSIS — J4540 Moderate persistent asthma, uncomplicated: Secondary | ICD-10-CM

## 2017-08-08 DIAGNOSIS — I10 Essential (primary) hypertension: Secondary | ICD-10-CM

## 2017-08-08 DIAGNOSIS — J3089 Other allergic rhinitis: Secondary | ICD-10-CM | POA: Diagnosis not present

## 2017-08-08 DIAGNOSIS — J302 Other seasonal allergic rhinitis: Secondary | ICD-10-CM

## 2017-08-08 DIAGNOSIS — R000 Tachycardia, unspecified: Secondary | ICD-10-CM | POA: Diagnosis not present

## 2017-08-08 MED ORDER — SEMAGLUTIDE(0.25 OR 0.5MG/DOS) 2 MG/1.5ML ~~LOC~~ SOPN: 0.5000 mg | PEN_INJECTOR | SUBCUTANEOUS | 1 refills | Status: DC

## 2017-08-08 MED ORDER — MONTELUKAST SODIUM 10 MG PO TABS: 10.0000 mg | ORAL_TABLET | Freq: Every day | ORAL | 1 refills | Status: DC

## 2017-08-08 MED ORDER — OLMESARTAN MEDOXOMIL 20 MG PO TABS: 20.0000 mg | ORAL_TABLET | Freq: Every day | ORAL | 1 refills | Status: DC

## 2017-08-08 MED ORDER — BUDESONIDE-FORMOTEROL FUMARATE 160-4.5 MCG/ACT IN AERO: 2.0000 | INHALATION_SPRAY | Freq: Two times a day (BID) | RESPIRATORY_TRACT | 2 refills | Status: DC

## 2017-08-08 NOTE — Progress Notes (Signed)
Name: Matthew Barrett   MRN: [id number]    DOB: 1970-02-17   Date:08/08/2017       Progress Note  Subjective  Chief Complaint  Chief Complaint  Patient presents with  . Medication Refill    4 month F/U  . Hypertension    Edema in ankles occasionally  . Asthma    Wheezing sometimes at night-takes inhaler at nights  . Metabolic Syndrome    Recently started back on Ozempic due to the price going up  . Obesity    HPI  Asthma:  Currently only using Symbicort in am's , he has noticed a wheezing at night and some shortness of breath. He has a physical job at [employer]. Walks a lot and has not symptoms during the day. Advised to resume Symbicort BID to control nocturnal symptoms.   HTN: bp is at goal, on Benicar, off HCTZ.Denies chest pain, dizziness or palpitation. BP is at goal today. His bp at home 130's/80's, his heart rate seems to up when he comes in , but slows down with rest. EKG last year showed normal sinus rhythm. Advised to monitor for now, explained beta-blockers should be avoided in patients with bronchospasm   Obesity: he hadlost a little over14lbsin 3 months after started on Ozempic 06/2016, but he has been out of medication for the past month because of cost, he is willing to try it again, states his wife went to pharmacy and rx was filled with a lower co-pay in 2019. He will get a refill now, advised to start at lower dose and titrate up again.   Metabolic syndrome: elevated bp, elevated triglycerides, increase in abdominal girth and increase insulin resistance. Hestates hewasdoing much better after he started Ozempic, noticed decrease in appetite, and not having polyphagia or polydipsia. However since off medication feeling very hungry again, gained weight back. Unchanged. He will resume Ozempic again     Patient Active Problem List   Diagnosis Date Noted  . Tachycardia 08/08/2017  . Hypertriglyceridemia 05/28/2016  . Hypertension 11/24/2015  .  Perennial allergic rhinitis with seasonal variation 12/10/2014  . Asthma, moderate persistent 12/10/2014  . Dyslipidemia 12/10/2014  . Obesity, Class III, BMI 40-49.9 (morbid obesity) (HCC) 12/10/2014    Past Surgical History:  Procedure Laterality Date  . TONSILECTOMY/ADENOIDECTOMY WITH MYRINGOTOMY      Family History  Problem Relation Age of Onset  . Hyperlipidemia Mother   . Hypertension Mother   . Hypothyroidism Mother   . Hyperlipidemia Sister   . Hypertension Sister   . Asthma Sister   . CAD Maternal Grandfather   . Hypertension Maternal Grandfather   . Congestive Heart Failure Paternal Grandfather     Social History   Socioeconomic History  . Marital status: Married    Spouse name: Not on file  . Number of children: Not on file  . Years of education: Not on file  . Highest education level: Not on file  Occupational History  . Not on file  Social Needs  . Financial resource strain: Not on file  . Food insecurity:    Worry: Not on file    Inability: Not on file  . Transportation needs:    Medical: Not on file    Non-medical: Not on file  Tobacco Use  . Smoking status: Never Smoker  . Smokeless tobacco: Former Neurosurgeon    Types: Chew  Substance and Sexual Activity  . Alcohol use: No    Alcohol/week: 0.0 oz  . Drug use: No  .  Sexual activity: Yes    Partners: Female  Lifestyle  . Physical activity:    Days per week: Not on file    Minutes per session: Not on file  . Stress: Not on file  Relationships  . Social connections:    Talks on phone: Not on file    Gets together: Not on file    Attends religious service: Not on file    Active member of club or organization: Not on file    Attends meetings of clubs or organizations: Not on file    Relationship status: Not on file  . Intimate partner violence:    Fear of current or ex partner: Not on file    Emotionally abused: Not on file    Physically abused: Not on file    Forced sexual activity: Not on  file  Other Topics Concern  . Not on file  Social History Narrative  . Not on file     Current Outpatient Medications:  .  albuterol (PROAIR HFA) 108 (90 Base) MCG/ACT inhaler, INHALE TWO PUFFS BY MOUTH EVERY 6 HOURS AS NEEDED FOR WHEEZING OR  SHORTNESS  OF  BREATH, Disp: 9 each, Rfl: 1 .  azelastine (ASTELIN) 0.1 % nasal spray, Place 1 spray into the nose as needed., Disp: , Rfl:  .  budesonide-formoterol (SYMBICORT) 160-4.5 MCG/ACT inhaler, Inhale 2 puffs into the lungs 2 (two) times daily., Disp: 1 Inhaler, Rfl: 2 .  fluticasone (FLONASE) 50 MCG/ACT nasal spray, Place 2 sprays into both nostrils daily. (Patient taking differently: Place 2 sprays into both nostrils as needed for allergies. ), Disp: 16 g, Rfl: 0 .  loratadine (CLARITIN) 10 MG tablet, Take 10 mg by mouth daily as needed for allergies., Disp: , Rfl:  .  montelukast (SINGULAIR) 10 MG tablet, Take 1 tablet (10 mg total) by mouth daily., Disp: 90 tablet, Rfl: 1 .  olmesartan (BENICAR) 20 MG tablet, Take 1 tablet (20 mg total) by mouth daily., Disp: 90 tablet, Rfl: 1 .  Semaglutide (OZEMPIC) 0.25 or 0.5 MG/DOSE SOPN, Inject 0.5 mg into the skin once a week., Disp: 3 mL, Rfl: 1  Allergies  Allergen Reactions  . Aspirin   . Tetanus Toxoids Other (See Comments)    Flared up Asthma     ROS  Constitutional: Negative for fever , positive for weight change.  Respiratory: Negative for cough and shortness of breath.  but has noticed wheezing  Cardiovascular: Negative for chest pain or palpitations.  Gastrointestinal: Negative for abdominal pain, no bowel changes.  Musculoskeletal: Negative for gait problem or joint swelling.  Skin: Negative for rash.  Neurological: Negative for dizziness or headache.  No other specific complaints in a complete review of systems (except as listed in HPI above).  Objective  Vitals:   08/08/17 1604 08/08/17 1622  BP: 130/86   Pulse: (!) 118 97  Resp: 16   Temp: 98.2 F (36.8 C)    TempSrc: Oral   SpO2: 98%   Weight: 266 lb 4.8 oz (120.8 kg)   Height: 5\' 7"  (1.702 m)     Body mass index is 41.71 kg/m.  Physical Exam  Constitutional: Patient appears well-developed and well-nourished. Obese No distress.  HEENT: head atraumatic, normocephalic, pupils equal and reactive to light, neck supple, throat within normal limits Cardiovascular: Normal rate, regular rhythm and normal heart sounds.  No murmur heard. No BLE edema. Pulmonary/Chest: Effort normal and breath sounds normal. No respiratory distress. Abdominal: Soft.  There is no tenderness.  Psychiatric: Patient has a normal mood and affect. behavior is normal. Judgment and thought content normal.  PHQ2/9: Depression screen [facility] 2/9 08/08/2017 04/11/2017 12/22/2016 09/21/2016 07/01/2016  Decreased Interest 0 0 0 0 0  Down, Depressed, Hopeless 0 0 0 0 0  PHQ - 2 Score 0 0 0 0 0     Fall Risk: Fall Risk  08/08/2017 04/11/2017 02/10/2017 12/22/2016 09/21/2016  Falls in the past year? No No No No No  Number falls in past yr: - - - - -  Injury with Fall? - - - - -  Comment - - - - -     Functional Status Survey: Is the patient deaf or have difficulty hearing?: Yes Does the patient have difficulty seeing, even when wearing glasses/contacts?: No Does the patient have difficulty concentrating, remembering, or making decisions?: No Does the patient have difficulty walking or climbing stairs?: No Does the patient have difficulty dressing or bathing?: No Does the patient have difficulty doing errands alone such as visiting a doctor's office or shopping?: No    Assessment & Plan  1. Tachycardia  Asymptomatic and normal eKG in the past   2. Moderate persistent asthma without complication  Resume using Symbicort BID  - budesonide-formoterol (SYMBICORT) 160-4.5 MCG/ACT inhaler; Inhale 2 puffs into the lungs 2 (two) times daily.  Dispense: 1 Inhaler; Refill: 2 - montelukast (SINGULAIR) 10 MG tablet; Take 1 tablet (10 mg  total) by mouth daily.  Dispense: 90 tablet; Refill: 1  3. Perennial allergic rhinitis with seasonal variation  - montelukast (SINGULAIR) 10 MG tablet; Take 1 tablet (10 mg total) by mouth daily.  Dispense: 90 tablet; Refill: 1  4. Essential hypertension  - olmesartan (BENICAR) 20 MG tablet; Take 1 tablet (20 mg total) by mouth daily.  Dispense: 90 tablet; Refill: 1  5. Obesity, Class III, BMI 40-49.9 (morbid obesity) (HCC)  Resume Ozempic, discussed life style modification   6. Metabolic syndrome  - Semaglutide (OZEMPIC) 0.25 or 0.5 MG/DOSE SOPN; Inject 0.5 mg into the skin once a week.  Dispense: 3 mL; Refill: 1

## 2017-09-09 ENCOUNTER — Ambulatory Visit: Payer: BLUE CROSS/BLUE SHIELD | Admitting: Family Medicine

## 2017-09-11 ENCOUNTER — Other Ambulatory Visit: Payer: Self-pay

## 2017-09-11 ENCOUNTER — Emergency Department
Admission: EM | Admit: 2017-09-11 | Discharge: 2017-09-11 | Disposition: A | Discharge status: Home / Self Care | Payer: BLUE CROSS/BLUE SHIELD | Attending: Emergency Medicine | Admitting: Emergency Medicine

## 2017-09-11 DIAGNOSIS — J4540 Moderate persistent asthma, uncomplicated: Secondary | ICD-10-CM | POA: Insufficient documentation

## 2017-09-11 DIAGNOSIS — E785 Hyperlipidemia, unspecified: Secondary | ICD-10-CM | POA: Diagnosis not present

## 2017-09-11 DIAGNOSIS — T63461A Toxic effect of venom of wasps, accidental (unintentional), initial encounter: Secondary | ICD-10-CM | POA: Diagnosis present

## 2017-09-11 DIAGNOSIS — Z79899 Other long term (current) drug therapy: Secondary | ICD-10-CM | POA: Diagnosis not present

## 2017-09-11 MED ORDER — HYDROXYZINE HCL 50 MG PO TABS: 50.0000 mg | ORAL_TABLET | Freq: Three times a day (TID) | ORAL | 0 refills | Status: DC | PRN

## 2017-09-11 MED ORDER — METHYLPREDNISOLONE 4 MG PO TBPK: ORAL_TABLET | ORAL | 0 refills | Status: DC

## 2017-09-11 MED ORDER — HYDROXYZINE HCL 50 MG PO TABS
50.0000 mg | ORAL_TABLET | Freq: Once | ORAL | Status: AC
  Administered 2017-09-11: 50 mg via ORAL
  Filled 2017-09-11: qty 1

## 2017-09-11 MED ORDER — METHYLPREDNISOLONE SODIUM SUCC 125 MG IJ SOLR
125.0000 mg | Freq: Once | INTRAMUSCULAR | Status: AC
  Administered 2017-09-11: 125 mg via INTRAMUSCULAR
  Filled 2017-09-11: qty 2

## 2017-09-11 NOTE — ED Triage Notes (Signed)
Stung by a wasp on back of right hand yesterday. States the stinger was left in for a couple of hours before realized it. Presents today with swelling of right hand, redness and warm. No red streaks noted traveling upwards on the arm. Denies a previous allergy to bee stings. Denies any respiratory problems or chest pain. Took Claritin this morning.

## 2017-09-11 NOTE — ED Provider Notes (Signed)
[facility] Emergency Department Provider Note   ____________________________________________   First MD Initiated Contact with Patient 09/11/17 1922     (approximate)  I have reviewed the triage vital signs and the nursing notes.   HISTORY  Chief Complaint Insect Bite    HPI Matthew Barrett is a 47 y.o. male patient presents with wall stating to the back of his right hand yesterday.  Patient states the finger remain in hand for a few hours.  Awakened this morning with increased swelling which is not resolved with Claritin.  Patient denies angioedema or any anaphylactic signs or symptoms.  Patient denies loss of function or loss of sensation.  Patient rates his pain discomfort as a 3/10.  Patient describes his pain is "aching".  Past Medical History:  Diagnosis Date  . Allergy   . Asthma   . Elevated blood pressure   . Hyperlipidemia   . Obesity   . Tachycardia     Patient Active Problem List   Diagnosis Date Noted  . Tachycardia 08/08/2017  . Hypertriglyceridemia 05/28/2016  . Hypertension 11/24/2015  . Perennial allergic rhinitis with seasonal variation 12/10/2014  . Asthma, moderate persistent 12/10/2014  . Dyslipidemia 12/10/2014  . Obesity, Class III, BMI 40-49.9 (morbid obesity) (HCC) 12/10/2014    Past Surgical History:  Procedure Laterality Date  . TONSILECTOMY/ADENOIDECTOMY WITH MYRINGOTOMY      Prior to Admission medications   Medication Sig Start Date End Date Taking? Authorizing Provider  albuterol (PROAIR HFA) 108 (90 Base) MCG/ACT inhaler INHALE TWO PUFFS BY MOUTH EVERY 6 HOURS AS NEEDED FOR WHEEZING OR  SHORTNESS  OF  BREATH 04/11/17   Carlynn PurlSowles, Danna HeftyKrichna, MD  azelastine (ASTELIN) 0.1 % nasal spray Place 1 spray into the nose as needed. 02/19/17   [provider]  budesonide-formoterol (SYMBICORT) 160-4.5 MCG/ACT inhaler Inhale 2 puffs into the lungs 2 (two) times daily. 08/08/17   Alba CorySowles, Krichna, MD  fluticasone (FLONASE)  50 MCG/ACT nasal spray Place 2 sprays into both nostrils daily. Patient taking differently: Place 2 sprays into both nostrils as needed for allergies.  02/10/17   Doren CustardBoyce, Emily E, FNP  hydrOXYzine (ATARAX/VISTARIL) 50 MG tablet Take 1 tablet (50 mg total) by mouth 3 (three) times daily as needed for itching. 09/11/17   Joni ReiningSmith, Ronald K, PA-C  loratadine (CLARITIN) 10 MG tablet Take 10 mg by mouth daily as needed for allergies.    [provider]  methylPREDNISolone (MEDROL DOSEPAK) 4 MG TBPK tablet Take Tapered dose as directed 09/11/17   Joni ReiningSmith, Ronald K, PA-C  montelukast (SINGULAIR) 10 MG tablet Take 1 tablet (10 mg total) by mouth daily. 08/08/17   Alba CorySowles, Krichna, MD  olmesartan (BENICAR) 20 MG tablet Take 1 tablet (20 mg total) by mouth daily. 08/08/17   Alba CorySowles, Krichna, MD  Semaglutide (OZEMPIC) 0.25 or 0.5 MG/DOSE SOPN Inject 0.5 mg into the skin once a week. 08/08/17   Alba CorySowles, Krichna, MD    Allergies Aspirin and Tetanus toxoids  Family History  Problem Relation Age of Onset  . Hyperlipidemia Mother   . Hypertension Mother   . Hypothyroidism Mother   . Hyperlipidemia Sister   . Hypertension Sister   . Asthma Sister   . CAD Maternal Grandfather   . Hypertension Maternal Grandfather   . Congestive Heart Failure Paternal Grandfather     Social History Social History   Tobacco Use  . Smoking status: Never Smoker  . Smokeless tobacco: Former NeurosurgeonUser    Types: Chew  Substance  Use Topics  . Alcohol use: No    Alcohol/week: 0.0 oz  . Drug use: No    Review of Systems Constitutional: No fever/chills Eyes: No visual changes. ENT: No sore throat. Cardiovascular: Denies chest pain. Respiratory: Denies shortness of breath. Gastrointestinal: No abdominal pain.  No nausea, no vomiting.  No diarrhea.  No constipation. Genitourinary: Negative for dysuria. Musculoskeletal: Negative for back pain. Skin: Negative for rash.  Swelling to right hand. Neurological: Negative for  headaches, focal weakness or numbness. Endocrine:Hyperlipidemia and hypertension. Hematological/Lymphatic: Allergic/Immunilogical: Aspirin and tetanus. ____________________________________________   PHYSICAL EXAM:  VITAL SIGNS: ED Triage Vitals  Enc Vitals Group     BP 09/11/17 1920 (!) 155/86     Pulse Rate 09/11/17 1920 99     Resp 09/11/17 1920 18     Temp 09/11/17 1920 98.5 F (36.9 C)     Temp Source 09/11/17 1920 Oral     SpO2 09/11/17 1920 97 %     Weight 09/11/17 1921 250 lb (113.4 kg)     Height 09/11/17 1921 5\' 8"  (1.727 m)     Head Circumference --      Peak Flow --      Pain Score 09/11/17 1920 3     Pain Loc --      Pain Edu? --      Excl. in GC? --     Constitutional: Alert and oriented. Well appearing and in no acute distress. Neck: No stridor. Cardiovascular: Normal rate, regular rhythm. Grossly normal heart sounds.  Good peripheral circulation. Respiratory: Normal respiratory effort.  No retractions. Lungs CTAB. Neurologic:  Normal speech and language. No gross focal neurologic deficits are appreciated. No gait instability. Skin:  Skin is warm, dry and intact. No rash noted.  Edema and mild erythema dorsal aspect the right hand. Psychiatric: Mood and affect are normal. Speech and behavior are normal.  ____________________________________________   LABS (all labs ordered are listed, but only abnormal results are displayed)  Labs Reviewed - No data to display ____________________________________________  EKG   ____________________________________________  RADIOLOGY  ED MD interpretation:    Official radiology report(s): No results found.  ____________________________________________   PROCEDURES  Procedure(s) performed: None  Procedures  Critical Care performed: No  ____________________________________________   INITIAL IMPRESSION / ASSESSMENT AND PLAN / ED COURSE  As part of my medical decision making, I reviewed the following  data within the electronic MEDICAL RECORD NUMBER    Localized reaction to insect staying dorsal aspect the right hand.  Patient given Solu-Medrol and Atarax prior to departure.  Patient given discharge care instructions advised to take medications as directed.  Patient advised follow-up PCP as needed.   ____________________________________________   FINAL CLINICAL IMPRESSION(S) / ED DIAGNOSES  Final diagnoses:  Wasp sting, accidental or unintentional, initial encounter     ED Discharge Orders        Ordered    methylPREDNISolone (MEDROL DOSEPAK) 4 MG TBPK tablet     09/11/17 1941    hydrOXYzine (ATARAX/VISTARIL) 50 MG tablet  3 times daily PRN     09/11/17 1941       Note:  This document was prepared using Dragon voice recognition software and may include unintentional dictation errors.    Joni Reining, PA-C 09/11/17 1948    Loleta Rose, MD 09/11/17 2011

## 2017-11-09 ENCOUNTER — Encounter: Payer: BLUE CROSS/BLUE SHIELD | Admitting: Family Medicine

## 2017-11-25 ENCOUNTER — Encounter: Payer: Self-pay | Admitting: Family Medicine

## 2017-11-25 ENCOUNTER — Ambulatory Visit (INDEPENDENT_AMBULATORY_CARE_PROVIDER_SITE_OTHER): Payer: BLUE CROSS/BLUE SHIELD | Admitting: Family Medicine

## 2017-11-25 VITALS — BP 100/80 | HR 95 | Temp 98.0°F | Resp 16 | Ht 67.0 in | Wt 255.6 lb

## 2017-11-25 DIAGNOSIS — I10 Essential (primary) hypertension: Secondary | ICD-10-CM

## 2017-11-25 DIAGNOSIS — J4540 Moderate persistent asthma, uncomplicated: Secondary | ICD-10-CM

## 2017-11-25 DIAGNOSIS — E8881 Metabolic syndrome: Secondary | ICD-10-CM

## 2017-11-25 DIAGNOSIS — J3089 Other allergic rhinitis: Secondary | ICD-10-CM | POA: Diagnosis not present

## 2017-11-25 DIAGNOSIS — Z23 Encounter for immunization: Secondary | ICD-10-CM

## 2017-11-25 DIAGNOSIS — E781 Pure hyperglyceridemia: Secondary | ICD-10-CM

## 2017-11-25 DIAGNOSIS — J302 Other seasonal allergic rhinitis: Secondary | ICD-10-CM

## 2017-11-25 MED ORDER — MONTELUKAST SODIUM 10 MG PO TABS: 10.0000 mg | ORAL_TABLET | Freq: Every day | ORAL | 1 refills | Status: DC

## 2017-11-25 MED ORDER — SEMAGLUTIDE(0.25 OR 0.5MG/DOS) 2 MG/1.5ML ~~LOC~~ SOPN: 0.5000 mg | PEN_INJECTOR | SUBCUTANEOUS | 1 refills | Status: DC

## 2017-11-25 MED ORDER — BUDESONIDE-FORMOTEROL FUMARATE 160-4.5 MCG/ACT IN AERO: 2.0000 | INHALATION_SPRAY | Freq: Two times a day (BID) | RESPIRATORY_TRACT | 2 refills | Status: DC

## 2017-11-25 MED ORDER — OLMESARTAN MEDOXOMIL 20 MG PO TABS: 20.0000 mg | ORAL_TABLET | Freq: Every day | ORAL | 1 refills | Status: DC

## 2017-11-25 NOTE — Progress Notes (Signed)
Name: Matthew Barrett   MRN: [id number]    DOB: 01-17-1971   Date:11/25/2017       Progress Note  Subjective  Chief Complaint  Chief Complaint  Patient presents with  . Medication Refill  . Hypertension    HPI  Asthma:  Currently using Symbicort BID most of the time, since he resumed BID and wheezing resolve, no cough or SOB. He has a physical job at [employer]. Walks a lot and has not symptoms during the day.   HTN:bp is at goal, on Benicar 20mg  .Denies chest pain, dizziness or palpitation. BP is towards low end of normal today, but usually not this low.   Obesity:he resumed Ozempic this Summer and has lost 11 lbs since, he states this time around he has been feeling a little more nauseated initially but symptoms of nausea not as bad. He wants to continue medication His highest weight was 270 lbs. He is drinking less sodas , at most 24 ounces bottles. He is drinking more water and black coffee.   Metabolic syndrome:history of hypertension, elevated triglycerides, increase in abdominal girth and increase insulin resistance. Hestates hewasdoing much better after he started Ozempic, noticed decrease in appetite, and not having polyphagia or polydipsia. Weight is down 11 lbs int he past 3 months    Patient Active Problem List   Diagnosis Date Noted  . Tachycardia 08/08/2017  . Hypertriglyceridemia 05/28/2016  . Hypertension 11/24/2015  . Perennial allergic rhinitis with seasonal variation 12/10/2014  . Asthma, moderate persistent 12/10/2014  . Dyslipidemia 12/10/2014  . Obesity, Class III, BMI 40-49.9 (morbid obesity) (HCC) 12/10/2014    Past Surgical History:  Procedure Laterality Date  . TONSILECTOMY/ADENOIDECTOMY WITH MYRINGOTOMY      Family History  Problem Relation Age of Onset  . Hyperlipidemia Mother   . Hypertension Mother   . Hypothyroidism Mother   . Hyperlipidemia Sister   . Hypertension Sister   . Asthma Sister   . CAD Maternal Grandfather   .  Hypertension Maternal Grandfather   . Congestive Heart Failure Paternal Grandfather     Social History   Socioeconomic History  . Marital status: Married    Spouse name: Not on file  . Number of children: 0  . Years of education: Not on file  . Highest education level: 12th grade  Occupational History  . Not on file  Social Needs  . Financial resource strain: Somewhat hard  . Food insecurity:    Worry: Never true    Inability: Never true  . Transportation needs:    Medical: No    Non-medical: No  Tobacco Use  . Smoking status: Never Smoker  . Smokeless tobacco: Former Neurosurgeon    Types: Chew  Substance and Sexual Activity  . Alcohol use: No    Alcohol/week: 0.0 standard drinks  . Drug use: No  . Sexual activity: Yes    Partners: Female  Lifestyle  . Physical activity:    Days per week: 2 days    Minutes per session: 20 min  . Stress: To some extent  Relationships  . Social connections:    Talks on phone: More than three times a week    Gets together: Once a week    Attends religious service: More than 4 times per year    Active member of club or organization: No    Attends meetings of clubs or organizations: Never    Relationship status: Married  . Intimate partner violence:    Fear of  current or ex partner: No    Emotionally abused: No    Physically abused: No    Forced sexual activity: No  Other Topics Concern  . Not on file  Social History Narrative  . Not on file     Current Outpatient Medications:  .  albuterol (PROAIR HFA) 108 (90 Base) MCG/ACT inhaler, INHALE TWO PUFFS BY MOUTH EVERY 6 HOURS AS NEEDED FOR WHEEZING OR  SHORTNESS  OF  BREATH, Disp: 9 each, Rfl: 1 .  azelastine (ASTELIN) 0.1 % nasal spray, Place 1 spray into the nose as needed., Disp: , Rfl:  .  budesonide-formoterol (SYMBICORT) 160-4.5 MCG/ACT inhaler, Inhale 2 puffs into the lungs 2 (two) times daily., Disp: 1 Inhaler, Rfl: 2 .  fluticasone (FLONASE) 50 MCG/ACT nasal spray, Place 2  sprays into both nostrils daily. (Patient taking differently: Place 2 sprays into both nostrils as needed for allergies. ), Disp: 16 g, Rfl: 0 .  loratadine (CLARITIN) 10 MG tablet, Take 10 mg by mouth daily as needed for allergies., Disp: , Rfl:  .  montelukast (SINGULAIR) 10 MG tablet, Take 1 tablet (10 mg total) by mouth daily., Disp: 90 tablet, Rfl: 1 .  olmesartan (BENICAR) 20 MG tablet, Take 1 tablet (20 mg total) by mouth daily., Disp: 90 tablet, Rfl: 1 .  Semaglutide (OZEMPIC) 0.25 or 0.5 MG/DOSE SOPN, Inject 0.5 mg into the skin once a week., Disp: 3 mL, Rfl: 1 .  hydrOXYzine (ATARAX/VISTARIL) 50 MG tablet, Take 1 tablet (50 mg total) by mouth 3 (three) times daily as needed for itching. (Patient not taking: Reported on 11/25/2017), Disp: 15 tablet, Rfl: 0  Allergies  Allergen Reactions  . Aspirin   . Tetanus Toxoids Other (See Comments)    Flared up Asthma    I personally reviewed active problem list, medication list, allergies, family history, social history with the patient/caregiver today.   ROS  Constitutional: Negative for fever, positive for  weight change.  Respiratory: Negative for cough and shortness of breath.   Cardiovascular: Negative for chest pain or palpitations.  Gastrointestinal: Negative for abdominal pain, no bowel changes.  Musculoskeletal: Negative for gait problem or joint swelling.  Skin: Negative for rash.  Neurological: Negative for dizziness or headache.  No other specific complaints in a complete review of systems (except as listed in HPI above).  Objective  Vitals:   11/25/17 1109  BP: 100/80  Pulse: 95  Resp: 16  Temp: 98 F (36.7 C)  TempSrc: Oral  SpO2: 96%  Weight: 255 lb 9.6 oz (115.9 kg)  Height: 5\' 7"  (1.702 m)    Body mass index is 40.03 kg/m.  Physical Exam  Constitutional: Patient appears well-developed and well-nourished. Obese No distress.  HEENT: head atraumatic, normocephalic, pupils equal and reactive to light, neck  supple, throat within normal limits Cardiovascular: Normal rate, regular rhythm and normal heart sounds.  No murmur heard. No BLE edema. Pulmonary/Chest: Effort normal and breath sounds normal. No respiratory distress. Abdominal: Soft.  There is no tenderness. Psychiatric: Patient has a normal mood and affect. behavior is normal. Judgment and thought content normal.  PHQ2/9: Depression screen [facility] 2/9 08/08/2017 04/11/2017 12/22/2016 09/21/2016 07/01/2016  Decreased Interest 0 0 0 0 0  Down, Depressed, Hopeless 0 0 0 0 0  PHQ - 2 Score 0 0 0 0 0     Fall Risk: Fall Risk  11/25/2017 08/08/2017 04/11/2017 02/10/2017 12/22/2016  Falls in the past year? No No No No No  Number falls in  past yr: - - - - -  Injury with Fall? - - - - -  Comment - - - - -     Functional Status Survey: Is the patient deaf or have difficulty hearing?: No Does the patient have difficulty seeing, even when wearing glasses/contacts?: No Does the patient have difficulty concentrating, remembering, or making decisions?: No Does the patient have difficulty walking or climbing stairs?: No Does the patient have difficulty dressing or bathing?: No Does the patient have difficulty doing errands alone such as visiting a doctor's office or shopping?: No    Assessment & Plan  1. Moderate persistent asthma without complication  - budesonide-formoterol (SYMBICORT) 160-4.5 MCG/ACT inhaler; Inhale 2 puffs into the lungs 2 (two) times daily.  Dispense: 1 Inhaler; Refill: 2 - montelukast (SINGULAIR) 10 MG tablet; Take 1 tablet (10 mg total) by mouth daily.  Dispense: 90 tablet; Refill: 1  2. Flu vaccine need  - Flu Vaccine QUAD 6+ mos PF IM (Fluarix Quad PF)  3. Perennial allergic rhinitis with seasonal variation  - montelukast (SINGULAIR) 10 MG tablet; Take 1 tablet (10 mg total) by mouth daily.  Dispense: 90 tablet; Refill: 1  4. Essential hypertension  - olmesartan (BENICAR) 20 MG tablet; Take 1 tablet (20 mg total) by  mouth daily.  Dispense: 90 tablet; Refill: 1  5. Metabolic syndrome  - Semaglutide,0.25 or 0.5MG /DOS, (OZEMPIC, 0.25 OR 0.5 MG/DOSE,) 2 MG/1.5ML SOPN; Inject 0.5 mg into the skin once a week.  Dispense: 9 mL; Refill: 1  6. Obesity, Class III, BMI 40-49.9 (morbid obesity) (HCC)  Discussed with the patient the risk posed by an increased BMI. Discussed importance of portion control, calorie counting and at least 150 minutes of physical activity weekly. Avoid sweet beverages and drink more water. Eat at least 6 servings of fruit and vegetables daily   7. Hypertriglyceridemia

## 2017-12-08 ENCOUNTER — Encounter: Payer: Self-pay | Admitting: Family Medicine

## 2017-12-08 ENCOUNTER — Ambulatory Visit (INDEPENDENT_AMBULATORY_CARE_PROVIDER_SITE_OTHER): Payer: BLUE CROSS/BLUE SHIELD | Admitting: Nurse Practitioner

## 2017-12-08 VITALS — BP 106/64 | HR 88 | Temp 98.0°F | Resp 16 | Ht 67.0 in | Wt 251.2 lb

## 2017-12-08 DIAGNOSIS — B880 Other acariasis: Secondary | ICD-10-CM

## 2017-12-08 DIAGNOSIS — L309 Dermatitis, unspecified: Secondary | ICD-10-CM

## 2017-12-08 MED ORDER — PREDNISONE 10 MG (21) PO TBPK: ORAL_TABLET | ORAL | 0 refills | Status: DC

## 2017-12-08 NOTE — Patient Instructions (Addendum)
-   Continue taking benadryl at night - Can still use anti-itch creams - Cool, moist compress on areas - Take prednisone as prescribed - Use bug spray when going out - If unimproved in a week or worsening please let us know.

## 2017-12-08 NOTE — Progress Notes (Signed)
Name: Matthew Barrett   MRN: [id number]    DOB: 29-Aug-1970   Date:12/08/2017       Progress Note  Subjective  Chief Complaint  Chief Complaint  Patient presents with  . Rash    Onset-couple of days ago, all over bilateral arms, wrist, hands and right side of his back. Red, itchy, swelling and hot to the touch. Has been putting Cortizone 10 on them for the itching and benadryl    HPI  Patient presents with rash on bilateral arms a few days ago, was outside doing yard work previously. Has tried benadryl oral, and hydrocortisone cream with minimal relief of symptoms.   Patient Active Problem List   Diagnosis Date Noted  . Tachycardia 08/08/2017  . Hypertriglyceridemia 05/28/2016  . Hypertension 11/24/2015  . Perennial allergic rhinitis with seasonal variation 12/10/2014  . Asthma, moderate persistent 12/10/2014  . Dyslipidemia 12/10/2014  . Obesity, Class III, BMI 40-49.9 (morbid obesity) (HCC) 12/10/2014    Past Medical History:  Diagnosis Date  . Allergy   . Asthma   . Elevated blood pressure   . Hyperlipidemia   . Obesity   . Tachycardia     Past Surgical History:  Procedure Laterality Date  . TONSILECTOMY/ADENOIDECTOMY WITH MYRINGOTOMY      Social History   Tobacco Use  . Smoking status: Never Smoker  . Smokeless tobacco: Former Neurosurgeon    Types: Chew  Substance Use Topics  . Alcohol use: No    Alcohol/week: 0.0 standard drinks     Current Outpatient Medications:  .  albuterol (PROAIR HFA) 108 (90 Base) MCG/ACT inhaler, INHALE TWO PUFFS BY MOUTH EVERY 6 HOURS AS NEEDED FOR WHEEZING OR  SHORTNESS  OF  BREATH, Disp: 9 each, Rfl: 1 .  azelastine (ASTELIN) 0.1 % nasal spray, Place 1 spray into the nose as needed., Disp: , Rfl:  .  budesonide-formoterol (SYMBICORT) 160-4.5 MCG/ACT inhaler, Inhale 2 puffs into the lungs 2 (two) times daily., Disp: 1 Inhaler, Rfl: 2 .  fluticasone (FLONASE) 50 MCG/ACT nasal spray, Place 2 sprays into both nostrils daily. (Patient  taking differently: Place 2 sprays into both nostrils as needed for allergies. ), Disp: 16 g, Rfl: 0 .  loratadine (CLARITIN) 10 MG tablet, Take 10 mg by mouth daily as needed for allergies., Disp: , Rfl:  .  montelukast (SINGULAIR) 10 MG tablet, Take 1 tablet (10 mg total) by mouth daily., Disp: 90 tablet, Rfl: 1 .  olmesartan (BENICAR) 20 MG tablet, Take 1 tablet (20 mg total) by mouth daily., Disp: 90 tablet, Rfl: 1 .  Semaglutide,0.25 or 0.5MG /DOS, (OZEMPIC, 0.25 OR 0.5 MG/DOSE,) 2 MG/1.5ML SOPN, Inject 0.5 mg into the skin once a week., Disp: 9 mL, Rfl: 1 .  predniSONE (STERAPRED UNI-PAK 21 TAB) 10 MG (21) TBPK tablet, Please take as directed on pack., Disp: 21 tablet, Rfl: 0  Allergies  Allergen Reactions  . Aspirin   . Tetanus Toxoids Other (See Comments)    Flared up Asthma    ROS    No other specific complaints in a complete review of systems (except as listed in HPI above).  Objective  Vitals:   12/08/17 1134 12/08/17 1151  BP: 106/64   Pulse: (!) 105 88  Resp: 16 16  Temp: 98 F (36.7 C)   TempSrc: Oral   SpO2: 98% 96%  Weight: 251 lb 3.2 oz (113.9 kg)   Height: 5\' 7"  (1.702 m)      Body mass index is 39.34 kg/m.  Nursing Note and Vital Signs reviewed.  Physical Exam  Constitutional: He is oriented to person, place, and time. He appears well-developed and well-nourished.  HENT:  Head: Normocephalic and atraumatic.  Eyes: Conjunctivae are normal.  Cardiovascular:  Mildly elevated rate, patient states this is his baseline   Pulmonary/Chest: Effort normal.  Musculoskeletal: Normal range of motion.  Neurological: He is alert and oriented to person, place, and time.  Skin:     Psychiatric: He has a normal mood and affect. His behavior is normal. Judgment and thought content normal.       No results found for this or any previous visit (from the past 48 hour(s)).  Assessment & Plan  1. Chigger bites Offered atarax for itching; declined at this  time will use benadryl and hydrocortisone cream. Informed of polyphagia with steroids- and educated on healthy diet. Use DEET bug spray when working in infested areas - predniSONE (STERAPRED UNI-PAK 21 TAB) 10 MG (21) TBPK tablet; Please take as directed on pack.  Dispense: 21 tablet; Refill: 0  2. Dermatitis - predniSONE (STERAPRED UNI-PAK 21 TAB) 10 MG (21) TBPK tablet; Please take as directed on pack.  Dispense: 21 tablet; Refill: 0  - Continue taking benadryl at night - Can still use anti-itch creams - Cool, moist compress on areas - Take prednisone as prescribed - Use bug spray when going out - If unimproved in a week or worsening please let us know.

## 2018-03-13 ENCOUNTER — Encounter: Payer: BLUE CROSS/BLUE SHIELD | Admitting: Family Medicine

## 2018-05-18 ENCOUNTER — Other Ambulatory Visit: Payer: Self-pay | Admitting: Family Medicine

## 2018-05-18 DIAGNOSIS — J4540 Moderate persistent asthma, uncomplicated: Secondary | ICD-10-CM

## 2018-05-19 MED ORDER — ALBUTEROL SULFATE HFA 108 (90 BASE) MCG/ACT IN AERS: INHALATION_SPRAY | RESPIRATORY_TRACT | 0 refills | Status: DC

## 2018-05-19 NOTE — Telephone Encounter (Signed)
Refill request for general medication. Albuterol to [facility]   Last office visit  11/25/2017   Follow up on 06/05/2018

## 2018-06-05 ENCOUNTER — Encounter: Payer: Self-pay | Admitting: Family Medicine

## 2018-06-05 ENCOUNTER — Ambulatory Visit (INDEPENDENT_AMBULATORY_CARE_PROVIDER_SITE_OTHER): Payer: 59 | Admitting: Family Medicine

## 2018-06-05 ENCOUNTER — Other Ambulatory Visit: Payer: Self-pay

## 2018-06-05 DIAGNOSIS — I10 Essential (primary) hypertension: Secondary | ICD-10-CM | POA: Diagnosis not present

## 2018-06-05 DIAGNOSIS — J3089 Other allergic rhinitis: Secondary | ICD-10-CM | POA: Diagnosis not present

## 2018-06-05 DIAGNOSIS — J302 Other seasonal allergic rhinitis: Secondary | ICD-10-CM

## 2018-06-05 DIAGNOSIS — J4540 Moderate persistent asthma, uncomplicated: Secondary | ICD-10-CM | POA: Diagnosis not present

## 2018-06-05 DIAGNOSIS — E781 Pure hyperglyceridemia: Secondary | ICD-10-CM

## 2018-06-05 DIAGNOSIS — E8881 Metabolic syndrome: Secondary | ICD-10-CM

## 2018-06-05 MED ORDER — OLMESARTAN MEDOXOMIL 20 MG PO TABS: 20.0000 mg | ORAL_TABLET | Freq: Every day | ORAL | 1 refills | Status: DC

## 2018-06-05 MED ORDER — MONTELUKAST SODIUM 10 MG PO TABS: 10.0000 mg | ORAL_TABLET | Freq: Every day | ORAL | 1 refills | Status: DC

## 2018-06-05 MED ORDER — SEMAGLUTIDE 3 MG PO TABS: 4.0000 | ORAL_TABLET | Freq: Every day | ORAL | 0 refills | Status: DC

## 2018-06-05 MED ORDER — FLUTICASONE PROPIONATE 50 MCG/ACT NA SUSP: 2.0000 | NASAL | 1 refills | Status: DC | PRN

## 2018-06-05 MED ORDER — BUDESONIDE-FORMOTEROL FUMARATE 160-4.5 MCG/ACT IN AERO: 2.0000 | INHALATION_SPRAY | Freq: Two times a day (BID) | RESPIRATORY_TRACT | 2 refills | Status: DC

## 2018-06-05 MED ORDER — AZELASTINE HCL 0.1 % NA SOLN: 1.0000 | NASAL | 1 refills | Status: DC | PRN

## 2018-06-05 NOTE — Progress Notes (Signed)
Name: Leaf Kernodle   MRN: [id number]    DOB: 1971-02-16   Date:06/05/2018       Progress Note  Subjective  Chief Complaint  Chief Complaint  Patient presents with  . Asthma    Symbicort twice daily is helping his symptoms   . Hypertension    Denies any symptoms  . Hyperlipidemia  . Tachycardia  . Medication Refill    6 month F/U  . Metabolic Syndrome    I connected with  [name]  on 06/05/18 at  1:20 PM EDT by a video enabled telemedicine application and verified that I am speaking with the correct person using two identifiers.  I discussed the limitations of evaluation and management by telemedicine and the availability of in person appointments. The patient expressed understanding and agreed to proceed. Staff also discussed with the patient that there may be a patient responsible charge related to this service. 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 He has a physical job at [employer].   HTN:bp is at goal, on Benicar  .Denies chest pain, dizziness or palpitation. BP at home not checked lately, it was 148/70 when last checked about one month ago. Discussed DASH diet and resume in physical activity   Obesity:he resumed Ozempic the  Summer and had lost 11 lbs since, however since last visit he stopped taking medication because it caused severe nausea again, wiling to try something else. He stopped [facility]. He thinks he gained 5 lbs since he stopped medication   Metabolic syndrome:history of hypertension, elevated triglycerides, increase in abdominal girth and increase insulin resistance. He denies polyphagia or polydipsia. He is off Ozempic but willing to try Ryblesus  Perennial  Allergic Rhinitis: he states this time of the year symptoms are worse. He has been sneezing again, some rhinorrhea and nasal congestion. Taking medication   Patient Active Problem List   Diagnosis Date Noted  . Tachycardia 08/08/2017  . Hypertriglyceridemia 05/28/2016  . Hypertension 11/24/2015  . Perennial allergic rhinitis with seasonal variation 12/10/2014  . Asthma, moderate persistent 12/10/2014  . Dyslipidemia 12/10/2014  . Obesity, Class III, BMI 40-49.9 (morbid obesity) (HCC) 12/10/2014    Past Surgical History:  Procedure Laterality Date  . TONSILECTOMY/ADENOIDECTOMY WITH MYRINGOTOMY      Family History  Problem Relation Age of Onset  . Hyperlipidemia Mother   . Hypertension Mother   . Hypothyroidism Mother   . Prostatitis Father        Enlarged Prostate   . Hyperlipidemia Sister   . Hypertension Sister   . Asthma Sister   . CAD Maternal Grandfather   . Hypertension Maternal Grandfather   . Congestive Heart Failure Paternal Grandfather     Social History   Socioeconomic History  . Marital status: Married    Spouse name: Rinaldo Cloud  . Number of children: 0  . Years of education: Not on file  . Highest education level: 12th grade  Occupational History  . Not on file  Social Needs  . Financial resource strain: Somewhat hard  . Food insecurity:    Worry: Never true    Inability: Never true  . Transportation needs:    Medical: No    Non-medical: No  Tobacco Use  . Smoking status: Never Smoker  . Smokeless tobacco: Former Neurosurgeon    Types: Chew  Substance and Sexual Activity  . Alcohol use: No    Alcohol/week: 0.0 standard drinks  . Drug use: No  . Sexual activity: Yes    Partners: Female  Lifestyle  . Physical activity:    Days per week: 2 days    Minutes per session: 20 min  . Stress: To some extent  Relationships  . Social connections:    Talks on phone: More than three times a week    Gets together: Once a week    Attends religious service: More  than 4 times per year    Active member of club or organization: No    Attends meetings of clubs or organizations: Never    Relationship status: Married  . Intimate partner violence:    Fear of current or ex partner: No    Emotionally abused: No    Physically abused: No    Forced sexual activity: No  Other Topics Concern  . Not on file  Social History Narrative  . Not on file     Current Outpatient Medications:  .  albuterol (PROAIR HFA) 108 (90 Base) MCG/ACT inhaler, INHALE TWO PUFFS BY MOUTH EVERY 6 HOURS AS NEEDED FOR WHEEZING OR  SHORTNESS  OF  BREATH, Disp: 9 each, Rfl: 0 .  azelastine (ASTELIN) 0.1 % nasal spray, Place 1 spray into both nostrils as needed., Disp: 30 mL, Rfl: 1 .  budesonide-formoterol (SYMBICORT) 160-4.5 MCG/ACT inhaler, Inhale 2 puffs into the lungs 2 (two) times daily., Disp: 1 Inhaler, Rfl: 2 .  fluticasone (FLONASE) 50 MCG/ACT nasal spray, Place 2 sprays into both nostrils as needed for allergies., Disp: 16 g, Rfl: 1 .  loratadine (CLARITIN) 10 MG tablet, Take 10 mg by mouth daily as needed for allergies., Disp: , Rfl:  .  montelukast (SINGULAIR) 10 MG tablet, Take 1 tablet (10 mg total) by mouth daily., Disp: 90 tablet, Rfl: 1 .  olmesartan (BENICAR) 20 MG tablet, Take 1 tablet (20 mg total) by mouth daily., Disp: 90 tablet, Rfl: 1 .  Semaglutide (RYBELSUS) 3 MG TABS, Take 4 tablets by mouth daily., Disp: 30 tablet, Rfl: 0  Allergies  Allergen Reactions  . Aspirin   . Tetanus Toxoids Other (See Comments)    Flared up Asthma    I personally reviewed active problem list, medication list, allergies, family history with the patient/caregiver today.   ROS  Constitutional: Negative for fever or weight change.  Respiratory: Negative for cough and shortness of breath.  SOB only with asthma flare - recently only when mowing her yard Cardiovascular: Negative for chest pain or palpitations.  Gastrointestinal: Negative for abdominal pain, no bowel changes.   Musculoskeletal: Negative for gait problem or joint swelling.  Skin: Negative for rash.  Neurological: Negative for dizziness or headache.  No other specific complaints in a complete review of systems (except as listed in HPI above).  Objective  Virtual encounter, vitals not obtained.  There is no height or weight on file to calculate BMI.  Physical Exam  Awake, alert, and oriented   PHQ2/9: Depression screen [facility] 2/9 06/05/2018 12/08/2017 08/08/2017 04/11/2017 12/22/2016  Decreased Interest 0 0 0 0 0  Down, Depressed, Hopeless 0 0 0 0 0  PHQ - 2 Score 0 0 0 0 0  Altered sleeping 0 - - - -  Tired, decreased energy 0 - - - -  Change in  appetite 0 - - - -  Feeling bad or failure about yourself  0 - - - -  Trouble concentrating 0 - - - -  Moving slowly or fidgety/restless 0 - - - -  Suicidal thoughts 0 - - - -  PHQ-9 Score 0 - - - -  Difficult doing work/chores Not difficult at all - - - -   PHQ-2/9 Result is negative.    Fall Risk: Fall Risk  06/05/2018 12/08/2017 11/25/2017 08/08/2017 04/11/2017  Falls in the past year? 0 No No No No  Number falls in past yr: 0 - - - -  Injury with Fall? 0 - - - -  Comment - - - - -     Assessment & Plan  1. Moderate persistent asthma without complication  - budesonide-formoterol (SYMBICORT) 160-4.5 MCG/ACT inhaler; Inhale 2 puffs into the lungs 2 (two) times daily.  Dispense: 1 Inhaler; Refill: 2 - montelukast (SINGULAIR) 10 MG tablet; Take 1 tablet (10 mg total) by mouth daily.  Dispense: 90 tablet; Refill: 1  2. Perennial allergic rhinitis with seasonal variation  - montelukast (SINGULAIR) 10 MG tablet; Take 1 tablet (10 mg total) by mouth daily.  Dispense: 90 tablet; Refill: 1 - fluticasone (FLONASE) 50 MCG/ACT nasal spray; Place 2 sprays into both nostrils as needed for allergies.  Dispense: 16 g; Refill: 1 - azelastine (ASTELIN) 0.1 % nasal spray; Place 1 spray into both nostrils as needed.  Dispense: 30 mL; Refill: 1  3. Essential  hypertension  - olmesartan (BENICAR) 20 MG tablet; Take 1 tablet (20 mg total) by mouth daily.  Dispense: 90 tablet; Refill: 1  4. Metabolic syndrome  - Semaglutide (RYBELSUS) 3 MG TABS; Take 4 tablets by mouth daily.  Dispense: 30 tablet; Refill: 0  5. Obesity, Class III, BMI 40-49.9 (morbid obesity) (HCC)  Discussed with the patient the risk posed by an increased BMI. Discussed importance of portion control, calorie counting and at least 150 minutes of physical activity weekly. Avoid sweet beverages and drink more water. Eat at least 6 servings of fruit and vegetables daily   6. Hypertriglyceridemia  I discussed the assessment and treatment plan with the patient. The patient was provided an opportunity to ask questions and all were answered. The patient agreed with the plan and demonstrated an understanding of the instructions.  The patient was advised to call back or seek an in-person evaluation if the symptoms worsen or if the condition fails to improve as anticipated.  I provided 25 minutes of non-face-to-face time during this encounter.

## 2018-07-06 ENCOUNTER — Ambulatory Visit: Payer: 59 | Admitting: Family Medicine

## 2018-07-13 ENCOUNTER — Other Ambulatory Visit: Payer: Self-pay | Admitting: Family Medicine

## 2018-07-13 DIAGNOSIS — J4540 Moderate persistent asthma, uncomplicated: Secondary | ICD-10-CM

## 2018-10-10 ENCOUNTER — Encounter: Payer: 59 | Admitting: Family Medicine

## 2018-11-30 ENCOUNTER — Other Ambulatory Visit: Payer: Self-pay

## 2018-11-30 ENCOUNTER — Ambulatory Visit (INDEPENDENT_AMBULATORY_CARE_PROVIDER_SITE_OTHER): Payer: 59 | Admitting: Family Medicine

## 2018-11-30 ENCOUNTER — Encounter: Payer: Self-pay | Admitting: Family Medicine

## 2018-11-30 VITALS — BP 138/86 | HR 96 | Temp 97.5°F | Resp 16 | Ht 67.5 in | Wt 274.4 lb

## 2018-11-30 DIAGNOSIS — E8881 Metabolic syndrome: Secondary | ICD-10-CM

## 2018-11-30 DIAGNOSIS — N528 Other male erectile dysfunction: Secondary | ICD-10-CM

## 2018-11-30 DIAGNOSIS — Z1159 Encounter for screening for other viral diseases: Secondary | ICD-10-CM

## 2018-11-30 DIAGNOSIS — I10 Essential (primary) hypertension: Secondary | ICD-10-CM | POA: Diagnosis not present

## 2018-11-30 DIAGNOSIS — Z0000 Encounter for general adult medical examination without abnormal findings: Secondary | ICD-10-CM | POA: Diagnosis not present

## 2018-11-30 DIAGNOSIS — E785 Hyperlipidemia, unspecified: Secondary | ICD-10-CM

## 2018-11-30 DIAGNOSIS — J4540 Moderate persistent asthma, uncomplicated: Secondary | ICD-10-CM

## 2018-11-30 DIAGNOSIS — F520 Hypoactive sexual desire disorder: Secondary | ICD-10-CM

## 2018-11-30 DIAGNOSIS — J3089 Other allergic rhinitis: Secondary | ICD-10-CM

## 2018-11-30 DIAGNOSIS — Z114 Encounter for screening for human immunodeficiency virus [HIV]: Secondary | ICD-10-CM

## 2018-11-30 DIAGNOSIS — J302 Other seasonal allergic rhinitis: Secondary | ICD-10-CM

## 2018-11-30 MED ORDER — OLMESARTAN MEDOXOMIL 20 MG PO TABS: 20.0000 mg | ORAL_TABLET | Freq: Every day | ORAL | 1 refills | Status: DC

## 2018-11-30 MED ORDER — ALBUTEROL SULFATE HFA 108 (90 BASE) MCG/ACT IN AERS: INHALATION_SPRAY | RESPIRATORY_TRACT | 0 refills | Status: DC

## 2018-11-30 MED ORDER — MONTELUKAST SODIUM 10 MG PO TABS: 10.0000 mg | ORAL_TABLET | Freq: Every day | ORAL | 1 refills | Status: DC

## 2018-11-30 MED ORDER — FLUTICASONE PROPIONATE 50 MCG/ACT NA SUSP: 2.0000 | NASAL | 1 refills | Status: DC | PRN

## 2018-11-30 MED ORDER — OZEMPIC (1 MG/DOSE) 2 MG/1.5ML ~~LOC~~ SOPN: 1.0000 mg | PEN_INJECTOR | SUBCUTANEOUS | 1 refills | Status: DC

## 2018-11-30 MED ORDER — SILDENAFIL CITRATE 100 MG PO TABS: 50.0000 mg | ORAL_TABLET | Freq: Every day | ORAL | 0 refills | Status: DC | PRN

## 2018-11-30 MED ORDER — BUDESONIDE-FORMOTEROL FUMARATE 160-4.5 MCG/ACT IN AERO: 2.0000 | INHALATION_SPRAY | Freq: Two times a day (BID) | RESPIRATORY_TRACT | 2 refills | Status: DC

## 2018-11-30 NOTE — Progress Notes (Signed)
Name: Matthew Barrett   MRN: [id number]    DOB: [birth year]   Date:11/30/2018       Progress Note  Subjective  Chief Complaint  Chief Complaint  Patient presents with  . Annual Exam  . Medication Refill  . Asthma    Wheezes after his shift at work in [employer]  . Hypertension    Headaches-seasonal allergies  . Obesity    Would like to discuss weight medication or management  . Metabolic Syndrome    Likes Ozempic better and started back instead of Rybelus  . Sexual Problem    Having trouble with staying erection    HPI  Patient presents for annual CPE and follow up  Asthma:  Currentlyusing Symbicort BID, he states no problems while at work - even though it is dusty, he notices some wheezing in the pm he takes second dose of symbicort at night and symptoms resolves. No cough or sob .   HTN:bp is trending up, but still at goal,  on Benicar 20mg  .Denies chest pain, dizziness or palpitation. He will try to lose weight and see if bp improves   Morbid Obesity:we switched from [place] to Rybelsus but he states it was not working and he decided to go back on Ozempic. He asked for something else to help his lose weight, he gained over 23 lbs in the past year. Explained he needs to change his diet. He is drinking 40 ounces of sodas daily, explained that he cannot loose weight without changing his diet . He eats eggs and toast for breakfast, lunch sandwich or vienna sausages . Discussed carbohydrate restrictive diet   Metabolic syndrome:history of hypertension, elevated triglycerides, increase in abdominal girth and increase insulin resistance. He denies polyphagia or polydipsia.He is back on Ozempic 0.5 mg weekly, we will try to go up on dose.   Perennial Allergic Rhinitis: he states symptoms getting a little worse age because of Fall, he rotates between claritin and zyrtec   USPSTF grade A and B recommendations:  Diet: reviewed, discussed carbohydrate restrictive diet   Exercise: only active at work   Depression: phq 9 is negative Depression screen [facility] 2/9 11/30/2018 06/05/2018 12/08/2017 08/08/2017 04/11/2017  Decreased Interest 0 0 0 0 0  Down, Depressed, Hopeless 0 0 0 0 0  PHQ - 2 Score 0 0 0 0 0  Altered sleeping 0 0 - - -  Tired, decreased energy 0 0 - - -  Change in appetite 0 0 - - -  Feeling bad or failure about yourself  0 0 - - -  Trouble concentrating 0 0 - - -  Moving slowly or fidgety/restless 0 0 - - -  Suicidal thoughts 0 0 - - -  PHQ-9 Score 0 0 - - -  Difficult doing work/chores Not difficult at all Not difficult at all - - -    Hypertension:  BP Readings from Last 3 Encounters:  11/30/18 138/86  12/08/17 106/64  11/25/17 100/80    Obesity: Wt Readings from Last 3 Encounters:  11/30/18 274 lb 6.4 oz (124.5 kg)  12/08/17 251 lb 3.2 oz (113.9 kg)  11/25/17 255 lb 9.6 oz (115.9 kg)   BMI Readings from Last 3 Encounters:  11/30/18 42.34 kg/m  12/08/17 39.34 kg/m  11/25/17 40.03 kg/m     Lipids:  Lab Results  Component Value Date   CHOL 163 09/21/2016   CHOL 194 11/24/2015   CHOL 209 (H) 03/12/2015   Lab Results  Component Value  Date   HDL 50 09/21/2016   HDL 47 11/24/2015   HDL 49 03/12/2015   Lab Results  Component Value Date   LDLCALC 95 09/21/2016   LDLCALC 118 11/24/2015   LDLCALC 131 (H) 03/12/2015   Lab Results  Component Value Date   TRIG 92 09/21/2016   TRIG 145 11/24/2015   TRIG 146 03/12/2015   Lab Results  Component Value Date   CHOLHDL 3.3 09/21/2016   CHOLHDL 4.1 11/24/2015   CHOLHDL 4.3 03/12/2015   No results found for: LDLDIRECT Glucose:  Glucose, Bld  Date Value Ref Range Status  09/21/2016 86 65 - 99 mg/dL Final  40/98/119110/10/2015 79 65 - 99 mg/dL Final  47/82/956201/25/2017 90 65 - 99 mg/dL Final      [facility]  AUDIT-C Score  0      Married STD testing and prevention (HIV/chl/gon/syphilis): N/A Hep C: today   Skin cancer:  discussed atypical cell Colorectal cancer: start at age 47  Prostate cancer: discussed USPTF, father had surgery recently but it was not cancer   Lab Results  Component Value Date   PSA 0.3 12/09/2011    IPSS Questionnaire (AUA-7): Over the past month.   1)  How often have you had a sensation of not emptying your bladder completely after you finish urinating?  0 - Not at all  2)  How often have you had to urinate again less than two hours after you finished urinating? 0 - Not at all  3)  How often have you found you stopped and started again several times when you urinated?  0 - Not at all  4) How difficult have you found it to postpone urination?  0 - Not at all  5) How often have you had a weak urinary stream?  0 - Not at all  6) How often have you had to push or strain to begin urination?  0 - Not at all  7) How many times did you most typically get up to urinate from the time you went to bed until the time you got up in the morning?  1 - 1 time  Total score:  0-7 mildly symptomatic   8-19 moderately symptomatic   20-35 severely symptomatic    Lung cancer:   Low Dose CT Chest recommended if Age 47-80 years, 30 pack-year currently smoking OR have quit w/in 15years. Patient does not qualify.   AAA:  The USPSTF recommends one-time screening with ultrasonography in men ages 475 to 2175 years who have ever smoked ECG: 2018   Advanced Care Planning: A voluntary discussion about advance care planning including the explanation and discussion of advance directives.  Discussed health care proxy and Living will, and the patient was able to identify a health care proxy as wife   Patient does not have a living will at present time.   Patient Active Problem List   Diagnosis Date Noted  . Tachycardia 08/08/2017  . Hypertriglyceridemia 05/28/2016  . Hypertension 11/24/2015  . Perennial allergic rhinitis with seasonal variation 12/10/2014  . Asthma, moderate persistent 12/10/2014  . Dyslipidemia  12/10/2014  . Obesity, Class III, BMI 40-49.9 (morbid obesity) (HCC) 12/10/2014    Past Surgical History:  Procedure Laterality Date  . TONSILECTOMY/ADENOIDECTOMY WITH MYRINGOTOMY      Family History  Problem Relation Age of Onset  . Hyperlipidemia Mother   . Hypertension Mother   . Hypothyroidism Mother   . Heart block Mother   .  Benign prostatic hyperplasia Father   . Prostatitis Father        Had prostate removed due to being enlarged  . Hyperlipidemia Sister   . Hypertension Sister   . Asthma Sister   . CAD Maternal Grandfather   . Hypertension Maternal Grandfather   . Psoriasis Maternal Grandfather   . Dementia Maternal Grandfather   . Congestive Heart Failure Paternal Grandfather   . Diabetes Paternal Grandfather   . Arthritis Paternal Grandfather   . Kidney disease Paternal Grandfather   . Diabetes Maternal Grandmother   . Arthritis Maternal Grandmother   . Diabetes Paternal Grandmother   . Arthritis Paternal Grandmother   . Hypertension Sister   . Hyperlipidemia Sister   . Asthma Sister     Social History   Socioeconomic History  . Marital status: Married    Spouse name: Rinaldo Cloud  . Number of children: 0  . Years of education: Not on file  . Highest education level: 12th grade  Occupational History  . Not on file  Social Needs  . Financial resource strain: Somewhat hard  . Food insecurity    Worry: Never true    Inability: Never true  . Transportation needs    Medical: No    Non-medical: No  Tobacco Use  . Smoking status: Never Smoker  . Smokeless tobacco: Former Neurosurgeon    Types: Chew  Substance and Sexual Activity  . Alcohol use: No    Alcohol/week: 0.0 standard drinks  . Drug use: No  . Sexual activity: Yes    Partners: Female  Lifestyle  . Physical activity    Days per week: 0 days    Minutes per session: 0 min  . Stress: Not at all  Relationships  . Social connections    Talks on phone: More than three times a week    Gets together:  Once a week    Attends religious service: More than 4 times per year    Active member of club or organization: No    Attends meetings of clubs or organizations: Never    Relationship status: Married  . Intimate partner violence    Fear of current or ex partner: No    Emotionally abused: No    Physically abused: No    Forced sexual activity: No  Other Topics Concern  . Not on file  Social History Narrative   Married, no children     Current Outpatient Medications:  .  albuterol (VENTOLIN HFA) 108 (90 Base) MCG/ACT inhaler, INHALE 2 PUFFS BY MOUTH EVERY 6 HOURS AS NEEDED FOR WHEEZING FOR SHORTNESS OF BREATH, Disp: 9 g, Rfl: 0 .  azelastine (ASTELIN) 0.1 % nasal spray, Place 1 spray into both nostrils as needed., Disp: 30 mL, Rfl: 1 .  budesonide-formoterol (SYMBICORT) 160-4.5 MCG/ACT inhaler, Inhale 2 puffs into the lungs 2 (two) times daily., Disp: 1 Inhaler, Rfl: 2 .  cetirizine (ZYRTEC ALLERGY) 10 MG tablet, Take 10 mg by mouth daily., Disp: , Rfl:  .  Cholecalciferol (VITAMIN D) 50 MCG (2000 UT) CAPS, Take 1 capsule by mouth daily., Disp: , Rfl:  .  fluticasone (FLONASE) 50 MCG/ACT nasal spray, Place 2 sprays into both nostrils as needed for allergies., Disp: 16 g, Rfl: 1 .  montelukast (SINGULAIR) 10 MG tablet, Take 1 tablet (10 mg total) by mouth daily., Disp: 90 tablet, Rfl: 1 .  Multiple Vitamin (MULTI-VITAMIN) tablet, Take by mouth., Disp: , Rfl:  .  olmesartan (BENICAR) 20 MG tablet, Take 1  tablet (20 mg total) by mouth daily., Disp: 90 tablet, Rfl: 1 .  loratadine (CLARITIN) 10 MG tablet, Take 10 mg by mouth daily as needed for allergies., Disp: , Rfl:  .  Semaglutide, 1 MG/DOSE, (OZEMPIC, 1 MG/DOSE,) 2 MG/1.5ML SOPN, Inject 1 mg into the skin once a week., Disp: 6 pen, Rfl: 1  Allergies  Allergen Reactions  . Aspirin   . Tetanus Toxoids Other (See Comments)    Flared up Asthma     ROS  Constitutional: Negative for fever , positive for  weight change.  Respiratory:  Negative for cough and shortness of breath.   Cardiovascular: Negative for chest pain or palpitations.  Gastrointestinal: Negative for abdominal pain, no bowel changes.  Musculoskeletal: Negative for gait problem or joint swelling.  Skin: Negative for rash.  Neurological: Negative for dizziness or headache.  No other specific complaints in a complete review of systems (except as listed in HPI above).   Objective  Vitals:   11/30/18 0908  BP: 138/86  Pulse: 96  Resp: 16  Temp: (!) 97.5 F (36.4 C)  TempSrc: Temporal  SpO2: 99%  Weight: 274 lb 6.4 oz (124.5 kg)  Height: 5' 7.5" (1.715 m)    Body mass index is 42.34 kg/m.  Physical Exam  Constitutional: Patient appears well-developed and well-nourished. No distress.  HENT: Head: Normocephalic and atraumatic. Ears: B TMs ok, no erythema or effusion; Nose: Nose normal. Mouth/Throat: Oropharynx is clear and moist. No oropharyngeal exudate.  Eyes: Conjunctivae and EOM are normal. Pupils are equal, round, and reactive to light. No scleral icterus.  Neck: Normal range of motion. Neck supple. No JVD present. No thyromegaly present.  Cardiovascular: Normal rate, regular rhythm and normal heart sounds.  No murmur heard. No BLE edema. Pulmonary/Chest: Effort normal and breath sounds normal. No respiratory distress. Abdominal: Soft. Bowel sounds are normal, no distension. There is no tenderness. no masses MALE GENITALIA: Normal descended testes bilaterally, no masses palpated, no hernias, no lesions, no discharge RECTAL: Prostate exam not done  Musculoskeletal: Normal range of motion, no joint effusions. No gross deformities Neurological: he is alert and oriented to person, place, and time. No cranial nerve deficit. Coordination, balance, strength, speech and gait are normal.  Skin: Skin is warm and dry. No rash noted. No erythema.  Psychiatric: Patient has a normal mood and affect. behavior is normal. Judgment and thought content  normal.   PHQ2/9: Depression screen [facility] 2/9 11/30/2018 06/05/2018 12/08/2017 08/08/2017 04/11/2017  Decreased Interest 0 0 0 0 0  Down, Depressed, Hopeless 0 0 0 0 0  PHQ - 2 Score 0 0 0 0 0  Altered sleeping 0 0 - - -  Tired, decreased energy 0 0 - - -  Change in appetite 0 0 - - -  Feeling bad or failure about yourself  0 0 - - -  Trouble concentrating 0 0 - - -  Moving slowly or fidgety/restless 0 0 - - -  Suicidal thoughts 0 0 - - -  PHQ-9 Score 0 0 - - -  Difficult doing work/chores Not difficult at all Not difficult at all - - -     Fall Risk: Fall Risk  11/30/2018 06/05/2018 12/08/2017 11/25/2017 08/08/2017  Falls in the past year? 0 0 No No No  Number falls in past yr: 0 0 - - -  Injury with Fall? 0 0 - - -  Comment - - - - -    Assessment & Plan  1. Well  adult exam   2. Metabolic syndrome  - Hemoglobin A1c - Semaglutide, 1 MG/DOSE, (OZEMPIC, 1 MG/DOSE,) 2 MG/1.5ML SOPN; Inject 1 mg into the skin once a week.  Dispense: 6 pen; Refill: 1  3. Essential hypertension  - COMPLETE METABOLIC PANEL WITH GFR - CBC with Differential/Platelet - olmesartan (BENICAR) 20 MG tablet; Take 1 tablet (20 mg total) by mouth daily.  Dispense: 90 tablet; Refill: 1  4. Dyslipidemia  - Lipid panel  5. Need for hepatitis C screening test  - Hepatitis C antibody  6. Encounter for screening for HIV  - HIV Antibody (routine testing w rflx)  7. Other male erectile dysfunction  - Testosterone Total,Free,Bio, Males  8. Lack of libido  - Testosterone Total,Free,Bio, Males  9. Perennial allergic rhinitis with seasonal variation  - montelukast (SINGULAIR) 10 MG tablet; Take 1 tablet (10 mg total) by mouth daily.  Dispense: 90 tablet; Refill: 1 - fluticasone (FLONASE) 50 MCG/ACT nasal spray; Place 2 sprays into both nostrils as needed for allergies.  Dispense: 16 g; Refill: 1  10. Moderate persistent asthma without complication  - montelukast (SINGULAIR) 10 MG tablet; Take 1  tablet (10 mg total) by mouth daily.  Dispense: 90 tablet; Refill: 1 - budesonide-formoterol (SYMBICORT) 160-4.5 MCG/ACT inhaler; Inhale 2 puffs into the lungs 2 (two) times daily.  Dispense: 1 Inhaler; Refill: 2 - albuterol (VENTOLIN HFA) 108 (90 Base) MCG/ACT inhaler; INHALE 2 PUFFS BY MOUTH EVERY 6 HOURS AS NEEDED FOR WHEEZING FOR SHORTNESS OF BREATH  Dispense: 9 g; Refill: 0  -Prostate cancer screening and PSA options (with potential risks and benefits of testing vs not testing) were discussed along with recent recs/guidelines. -USPSTF grade A and B recommendations reviewed with patient; age-appropriate recommendations, preventive care, screening tests, etc discussed and encouraged; healthy living encouraged; see AVS for patient education given to patient -Discussed importance of 150 minutes of physical activity weekly, eat two servings of fish weekly, eat one serving of tree nuts ( cashews, pistachios, pecans, almonds.Marland Kitchen) every other day, eat 6 servings of fruit/vegetables daily and drink plenty of water and avoid sweet beverages.

## 2018-11-30 NOTE — Patient Instructions (Addendum)
Discussed carbohydrate restrictive diet First 2 weeks only 20 g of carbs per day ( download my fitness pal to your phone)  Drink more water at least 80 oz per day  No less than 110 g of protein per day  After first 2 weeks stay around 50 g of carbohydrates per day    Preventive Care 42-47 Years Old, Male Preventive care refers to lifestyle choices and visits with your health care provider that can promote health and wellness. This includes:  A yearly physical exam. This is also called an annual well check.  Regular dental and eye exams.  Immunizations.  Screening for certain conditions.  Healthy lifestyle choices, such as eating a healthy diet, getting regular exercise, not using drugs or products that contain nicotine and tobacco, and limiting alcohol use. What can I expect for my preventive care visit? Physical exam Your health care provider will check:  Height and weight. These may be used to calculate body mass index (BMI), which is a measurement that tells if you are at a healthy weight.  Heart rate and blood pressure.  Your skin for abnormal spots. Counseling Your health care provider may ask you questions about:  Alcohol, tobacco, and drug use.  Emotional well-being.  Home and relationship well-being.  Sexual activity.  Eating habits.  Work and work Statistician. What immunizations do I need?  Influenza (flu) vaccine  This is recommended every year. Tetanus, diphtheria, and pertussis (Tdap) vaccine  You may need a Td booster every 10 years. Varicella (chickenpox) vaccine  You may need this vaccine if you have not already been vaccinated. Zoster (shingles) vaccine  You may need this after age 90. Measles, mumps, and rubella (MMR) vaccine  You may need at least one dose of MMR if you were born in 1957 or later. You may also need a second dose. Pneumococcal conjugate (PCV13) vaccine  You may need this if you have certain conditions and were not  previously vaccinated. Pneumococcal polysaccharide (PPSV23) vaccine  You may need one or two doses if you smoke cigarettes or if you have certain conditions. Meningococcal conjugate (MenACWY) vaccine  You may need this if you have certain conditions. Hepatitis A vaccine  You may need this if you have certain conditions or if you travel or work in places where you may be exposed to hepatitis A. Hepatitis B vaccine  You may need this if you have certain conditions or if you travel or work in places where you may be exposed to hepatitis B. Haemophilus influenzae type b (Hib) vaccine  You may need this if you have certain risk factors. Human papillomavirus (HPV) vaccine  If recommended by your health care provider, you may need three doses over 6 months. You may receive vaccines as individual doses or as more than one vaccine together in one shot (combination vaccines). Talk with your health care provider about the risks and benefits of combination vaccines. What tests do I need? Blood tests  Lipid and cholesterol levels. These may be checked every 5 years, or more frequently if you are over 81 years old.  Hepatitis C test.  Hepatitis B test. Screening  Lung cancer screening. You may have this screening every year starting at age 24 if you have a 30-pack-year history of smoking and currently smoke or have quit within the past 15 years.  Prostate cancer screening. Recommendations will vary depending on your family history and other risks.  Colorectal cancer screening. All adults should have this screening starting  at age 51 and continuing until age 31. Your health care provider may recommend screening at age 28 if you are at increased risk. You will have tests every 1-10 years, depending on your results and the type of screening test.  Diabetes screening. This is done by checking your blood sugar (glucose) after you have not eaten for a while (fasting). You may have this done every 1-3  years.  Sexually transmitted disease (STD) testing. Follow these instructions at home: Eating and drinking  Eat a diet that includes fresh fruits and vegetables, whole grains, lean protein, and low-fat dairy products.  Take vitamin and mineral supplements as recommended by your health care provider.  Do not drink alcohol if your health care provider tells you not to drink.  If you drink alcohol: ? Limit how much you have to 0-2 drinks a day. ? Be aware of how much alcohol is in your drink. In the [place] one drink equals one 12 oz bottle of beer (355 mL), one 5 oz glass of wine (148 mL), or one 1 oz glass of hard liquor (44 mL). Lifestyle  Take daily care of your teeth and gums.  Stay active. Exercise for at least 30 minutes on 5 or more days each week.  Do not use any products that contain nicotine or tobacco, such as cigarettes, e-cigarettes, and chewing tobacco. If you need help quitting, ask your health care provider.  If you are sexually active, practice safe sex. Use a condom or other form of protection to prevent STIs (sexually transmitted infections).  Talk with your health care provider about taking a low-dose aspirin every day starting at age 39. What's next?  Go to your health care provider once a year for a well check visit.  Ask your health care provider how often you should have your eyes and teeth checked.  Stay up to date on all vaccines. This information is not intended to replace advice given to you by your health care provider. Make sure you discuss any questions you have with your health care provider. Document Released: 02/28/2015 Document Revised: 01/26/2018 Document Reviewed: 01/26/2018 Elsevier Patient Education  2020 [employer].

## 2018-12-01 LAB — COMPLETE METABOLIC PANEL WITH GFR
AG Ratio: 1.6 (calc) (ref 1.0–2.5)
ALT: 38 U/L (ref 9–46)
AST: 25 U/L (ref 10–40)
Albumin: 4.5 g/dL (ref 3.6–5.1)
Alkaline phosphatase (APISO): 77 U/L (ref 36–130)
BUN: 12 mg/dL (ref 7–25)
CO2: 26 mmol/L (ref 20–32)
Calcium: 9.6 mg/dL (ref 8.6–10.3)
Chloride: 103 mmol/L (ref 98–110)
Creat: 1.18 mg/dL (ref 0.60–1.35)
GFR, Est African American: 85 mL/min/{1.73_m2} (ref >=60)
GFR, Est Non African American: 74 mL/min/{1.73_m2} (ref >=60)
Globulin: 2.8 g/dL (calc) (ref 1.9–3.7)
Glucose, Bld: 105 mg/dL — ABNORMAL HIGH (ref 65–99)
Potassium: 4.8 mmol/L (ref 3.5–5.3)
Sodium: 138 mmol/L (ref 135–146)
Total Bilirubin: 0.4 mg/dL (ref 0.2–1.2)
Total Protein: 7.3 g/dL (ref 6.1–8.1)

## 2018-12-01 LAB — HEPATITIS C ANTIBODY
Hepatitis C Ab: NONREACTIVE
SIGNAL TO CUT-OFF: 0.02 (ref <1.00)

## 2018-12-01 LAB — CBC WITH DIFFERENTIAL/PLATELET
Absolute Monocytes: 553 cells/uL (ref 200–950)
Basophils Absolute: 42 cells/uL (ref 0–200)
Basophils Relative: 0.6 %
Eosinophils Absolute: 161 cells/uL (ref 15–500)
Eosinophils Relative: 2.3 %
HCT: 47.6 % (ref 38.5–50.0)
Hemoglobin: 16 g/dL (ref 13.2–17.1)
Lymphs Abs: 2198 cells/uL (ref 850–3900)
MCH: 30 pg (ref 27.0–33.0)
MCHC: 33.6 g/dL (ref 32.0–36.0)
MCV: 89.1 fL (ref 80.0–100.0)
MPV: 9.8 fL (ref 7.5–12.5)
Monocytes Relative: 7.9 %
Neutro Abs: 4046 cells/uL (ref 1500–7800)
Neutrophils Relative %: 57.8 %
Platelets: 336 10*3/uL (ref 140–400)
RBC: 5.34 10*6/uL (ref 4.20–5.80)
RDW: 12.7 % (ref 11.0–15.0)
Total Lymphocyte: 31.4 %
WBC: 7 10*3/uL (ref 3.8–10.8)

## 2018-12-01 LAB — LIPID PANEL
Cholesterol: 215 mg/dL — ABNORMAL HIGH (ref <200)
HDL: 47 mg/dL (ref >=40)
LDL Cholesterol (Calc): 136 mg/dL (calc) — ABNORMAL HIGH
Non-HDL Cholesterol (Calc): 168 mg/dL (calc) — ABNORMAL HIGH (ref <130)
Total CHOL/HDL Ratio: 4.6 (calc) (ref <5.0)
Triglycerides: 180 mg/dL — ABNORMAL HIGH (ref <150)

## 2018-12-01 LAB — HIV ANTIBODY (ROUTINE TESTING W REFLEX): HIV 1&2 Ab, 4th Generation: NONREACTIVE

## 2018-12-01 LAB — HEMOGLOBIN A1C
Hgb A1c MFr Bld: 5.5 % of total Hgb (ref <5.7)
Mean Plasma Glucose: 111 (calc)
eAG (mmol/L): 6.2 (calc)

## 2018-12-01 LAB — TESTOSTERONE TOTAL,FREE,BIO, MALES
Albumin: 4.5 g/dL (ref 3.6–5.1)
Sex Hormone Binding: 7 nmol/L — ABNORMAL LOW (ref 10–50)
Testosterone: 191 ng/dL — ABNORMAL LOW (ref 250–827)

## 2019-03-01 ENCOUNTER — Ambulatory Visit: Payer: 59 | Admitting: Family Medicine

## 2019-03-23 ENCOUNTER — Ambulatory Visit: Payer: 59 | Admitting: Family Medicine

## 2019-03-23 ENCOUNTER — Encounter: Payer: Self-pay | Admitting: Family Medicine

## 2019-03-23 ENCOUNTER — Other Ambulatory Visit: Payer: Self-pay

## 2019-03-23 VITALS — BP 128/82 | HR 105 | Temp 97.5°F | Resp 16 | Ht 67.75 in | Wt 272.0 lb

## 2019-03-23 DIAGNOSIS — J4540 Moderate persistent asthma, uncomplicated: Secondary | ICD-10-CM | POA: Diagnosis not present

## 2019-03-23 DIAGNOSIS — J302 Other seasonal allergic rhinitis: Secondary | ICD-10-CM

## 2019-03-23 DIAGNOSIS — N529 Male erectile dysfunction, unspecified: Secondary | ICD-10-CM

## 2019-03-23 DIAGNOSIS — J3089 Other allergic rhinitis: Secondary | ICD-10-CM

## 2019-03-23 DIAGNOSIS — I10 Essential (primary) hypertension: Secondary | ICD-10-CM

## 2019-03-23 DIAGNOSIS — E785 Hyperlipidemia, unspecified: Secondary | ICD-10-CM

## 2019-03-23 DIAGNOSIS — E8881 Metabolic syndrome: Secondary | ICD-10-CM | POA: Diagnosis not present

## 2019-03-23 MED ORDER — SILDENAFIL CITRATE 100 MG PO TABS: 50.0000 mg | ORAL_TABLET | Freq: Every day | ORAL | 1 refills | Status: DC | PRN

## 2019-03-23 NOTE — Progress Notes (Signed)
Name: Matthew Barrett   MRN: [id number]    DOB: 23-Oct-1970   Date:03/23/2019       Progress Note  Subjective  Chief Complaint  Chief Complaint  Patient presents with  . Medication Refill  . Asthma    Worst at the end of the day when slowing down  . Hypertension    Denies any symptoms  . Morbid Obesity    Insurance approved Ozempic and doing well on it  . Allergic Rhinitis   . Metabolic syndrome  . Lack of Libido    Viagra is helping his symptoms    HPI  Asthma:  Currentlyusing Symbicort BID, he states no problems while at work - even though it is dusty, he notices some wheezing in the evenings when watching TV. He denies sob but he has intermittent cough - very little   HTN:  bp was elevated when he came in, and has been trending up , he is on Benicar 20mg  .Denies chest pain, dizziness or palpitation.   Hyperlipidemia:  The 10-year ASCVD risk score DC Jr., et al., 2013) is: 3.6%   Values used to calculate the score:     Age: 49 years     Sex: Male     Is Non-Hispanic African American: No     Diabetic: No     Tobacco smoker: No     Systolic Blood Pressure: 128 mmHg     Is BP treated: Yes     HDL Cholesterol: 47 mg/dL     Total Cholesterol: 215 mg/dL   Morbid Obesity:we switched from Ozempic to Rybelsus but he states it was not working and he decided to go back on Ozempic. He asked for something else to help his lose weight, he gained over 23 lbs in the past year. Explained he needs to change his diet. He is drinking 40 ounces of sodas daily, explained that he cannot loose weight without changing his diet . He eats eggs and toast for breakfast, lunch sandwich or vienna sausages . Discussed carbohydrate restrictive diet   Metabolic syndrome:history of hypertension, elevated triglycerides, increase in abdominal girth and increase insulin resistance.He deniespolyphagia or polydipsia.He is taking Ozempic 1 mg since Fall 2020   Perennial Allergic Rhinitis  he  states symptoms are well controlled at this time, occasional sneezing.   Low Testosterone and ED: He started viagra fall 2020 and is doing well on medication, not on testosterone supplementation , he is trying to lose weight, he states viagra has improved ED and needs a refill Patient Active Problem List   Diagnosis Date Noted  . Tachycardia 08/08/2017  . Hypertriglyceridemia 05/28/2016  . Hypertension 11/24/2015  . Perennial allergic rhinitis with seasonal variation 12/10/2014  . Asthma, moderate persistent 12/10/2014  . Dyslipidemia 12/10/2014  . Obesity, Class III, BMI 40-49.9 (morbid obesity) (HCC) 12/10/2014    Past Surgical History:  Procedure Laterality Date  . TONSILECTOMY/ADENOIDECTOMY WITH MYRINGOTOMY      Family History  Problem Relation Age of Onset  . Hyperlipidemia Mother   . Hypertension Mother   . Hypothyroidism Mother   . Heart block Mother   . Benign prostatic hyperplasia Father   . Prostatitis Father        Had prostate removed due to being enlarged  . Hyperlipidemia Sister   . Hypertension Sister   . Asthma Sister   . CAD Maternal Grandfather   . Hypertension Maternal Grandfather   . Psoriasis Maternal Grandfather   . Dementia Maternal Grandfather   .  Congestive Heart Failure Paternal Grandfather   . Diabetes Paternal Grandfather   . Arthritis Paternal Grandfather   . Kidney disease Paternal Grandfather   . Diabetes Maternal Grandmother   . Arthritis Maternal Grandmother   . Diabetes Paternal Grandmother   . Arthritis Paternal Grandmother   . Hypertension Sister   . Hyperlipidemia Sister   . Asthma Sister     Current Outpatient Medications:  .  albuterol (VENTOLIN HFA) 108 (90 Base) MCG/ACT inhaler, INHALE 2 PUFFS BY MOUTH EVERY 6 HOURS AS NEEDED FOR WHEEZING FOR SHORTNESS OF BREATH, Disp: 9 g, Rfl: 0 .  azelastine (ASTELIN) 0.1 % nasal spray, Place 1 spray into both nostrils as needed., Disp: 30 mL, Rfl: 1 .  budesonide-formoterol (SYMBICORT)  160-4.5 MCG/ACT inhaler, Inhale 2 puffs into the lungs 2 (two) times daily., Disp: 1 Inhaler, Rfl: 2 .  cetirizine (ZYRTEC ALLERGY) 10 MG tablet, Take 10 mg by mouth daily., Disp: , Rfl:  .  Cholecalciferol (VITAMIN D) 50 MCG (2000 UT) CAPS, Take 1 capsule by mouth daily., Disp: , Rfl:  .  fluticasone (FLONASE) 50 MCG/ACT nasal spray, Place 2 sprays into both nostrils as needed for allergies., Disp: 16 g, Rfl: 1 .  loratadine (CLARITIN) 10 MG tablet, Take 10 mg by mouth daily as needed for allergies., Disp: , Rfl:  .  montelukast (SINGULAIR) 10 MG tablet, Take 1 tablet (10 mg total) by mouth daily., Disp: 90 tablet, Rfl: 1 .  Multiple Vitamin (MULTI-VITAMIN) tablet, Take by mouth., Disp: , Rfl:  .  olmesartan (BENICAR) 20 MG tablet, Take 1 tablet (20 mg total) by mouth daily., Disp: 90 tablet, Rfl: 1 .  Semaglutide, 1 MG/DOSE, (OZEMPIC, 1 MG/DOSE,) 2 MG/1.5ML SOPN, Inject 1 mg into the skin once a week., Disp: 6 pen, Rfl: 1 .  sildenafil (VIAGRA) 100 MG tablet, Take 0.5-1 tablets (50-100 mg total) by mouth daily as needed for erectile dysfunction., Disp: 30 tablet, Rfl: 1  Allergies  Allergen Reactions  . Aspirin   . Tetanus Toxoids Other (See Comments)    Flared up Asthma    I personally reviewed active problem list, medication list, allergies, family history, social history, health maintenance with the patient/caregiver today.   ROS  Constitutional: Negative for fever or weight change.  Respiratory: Positive for cough - mild but no shortness of breath.   Cardiovascular: Negative for chest pain or palpitations.  Gastrointestinal: Negative for abdominal pain, no bowel changes.  Musculoskeletal: Negative for gait problem or joint swelling.  Skin: Negative for rash.  Neurological: Negative for dizziness or headache.  No other specific complaints in a complete review of systems (except as listed in HPI above).  Objective  Vitals:   03/23/19 0908 03/23/19 0953  BP: (!) 128/96 128/82   Pulse: (!) 105   Resp: 16   Temp: (!) 97.5 F (36.4 C)   TempSrc: Temporal   SpO2: 98%   Weight: 272 lb (123.4 kg)   Height: 5' 7.75" (1.721 m)     Body mass index is 41.66 kg/m.  Physical Exam  Constitutional: Patient appears well-developed and well-nourished. Obese  No distress.  HEENT: head atraumatic, normocephalic, pupils equal and reactive to light Cardiovascular: Normal rate, regular rhythm and normal heart sounds.  No murmur heard. No BLE edema. Pulmonary/Chest: Effort normal and breath sounds normal. No respiratory distress. Abdominal: Soft.  There is no tenderness. Psychiatric: Patient has a normal mood and affect. behavior is normal. Judgment and thought content normal.  PHQ2/9: Depression screen PHQ  2/9 03/23/2019 11/30/2018 06/05/2018 12/08/2017 08/08/2017  Decreased Interest 0 0 0 0 0  Down, Depressed, Hopeless 0 0 0 0 0  PHQ - 2 Score 0 0 0 0 0  Altered sleeping 0 0 0 - -  Tired, decreased energy 0 0 0 - -  Change in appetite 0 0 0 - -  Feeling bad or failure about yourself  0 0 0 - -  Trouble concentrating 0 0 0 - -  Moving slowly or fidgety/restless 0 0 0 - -  Suicidal thoughts 0 0 0 - -  PHQ-9 Score 0 0 0 - -  Difficult doing work/chores Not difficult at all Not difficult at all Not difficult at all - -    phq 9 is negative   Fall Risk: Fall Risk  03/23/2019 11/30/2018 06/05/2018 12/08/2017 11/25/2017  Falls in the past year? 0 0 0 No No  Number falls in past yr: 0 0 0 - -  Injury with Fall? 0 0 0 - -  Comment - - - - -     Functional Status Survey: Is the patient deaf or have difficulty hearing?: No Does the patient have difficulty seeing, even when wearing glasses/contacts?: No Does the patient have difficulty concentrating, remembering, or making decisions?: No Does the patient have difficulty walking or climbing stairs?: No Does the patient have difficulty dressing or bathing?: No Does the patient have difficulty doing errands alone such as  visiting a doctor's office or shopping?: No    Assessment & Plan  1. Metabolic syndrome   2. Perennial allergic rhinitis with seasonal variation  Doing well at this time  3. Moderate persistent asthma without complication  He will check if any other medications are cheaper paying 25 for symbicort at this time  4. Dyslipidemia   5. Essential hypertension  At goal. Continue current dose  6. Obesity, Class III, BMI 40-49.9 (morbid obesity) (HCC)  Discussed with the patient the risk posed by an increased BMI. Discussed importance of portion control, calorie counting and at least 150 minutes of physical activity weekly. Avoid sweet beverages and drink more water. Eat at least 6 servings of fruit and vegetables daily   7. Erectile dysfunction, unspecified erectile dysfunction type  - sildenafil (VIAGRA) 100 MG tablet; Take 0.5-1 tablets (50-100 mg total) by mouth daily as needed for erectile dysfunction.  Dispense: 30 tablet; Refill: 1

## 2019-04-20 ENCOUNTER — Other Ambulatory Visit: Payer: Self-pay | Admitting: Family Medicine

## 2019-04-20 DIAGNOSIS — J4540 Moderate persistent asthma, uncomplicated: Secondary | ICD-10-CM

## 2019-05-24 ENCOUNTER — Ambulatory Visit: Payer: Self-pay | Attending: Internal Medicine

## 2019-05-24 DIAGNOSIS — Z23 Encounter for immunization: Secondary | ICD-10-CM

## 2019-05-24 NOTE — Progress Notes (Signed)
   Covid-19 Vaccination Clinic  Name:  Little Winton    MRN: [id number] DOB: [birth year]  05/24/2019  Mr. Cen was observed post Covid-19 immunization for 15 minutes without incident. He was provided with Vaccine Information Sheet and instruction to access the V-Safe system.   Mr. Weber was instructed to call 911 with any severe reactions post vaccine: Marland Kitchen Difficulty breathing  . Swelling of face and throat  . A fast heartbeat  . A bad rash all over body  . Dizziness and weakness   Immunizations Administered    Name Date Dose VIS Date Route   Pfizer COVID-19 Vaccine 05/24/2019  9:41 AM 0.3 mL 01/26/2019 Intramuscular   Manufacturer: [employer], [employer]   Lot: [id number]   NDC: [address]-4

## 2019-06-20 ENCOUNTER — Ambulatory Visit: Payer: Self-pay | Attending: Internal Medicine

## 2019-06-20 DIAGNOSIS — Z23 Encounter for immunization: Secondary | ICD-10-CM

## 2019-06-20 NOTE — Progress Notes (Signed)
   Covid-19 Vaccination Clinic  Name:  Dominick Morella    MRN: [id number] DOB: [birth year]  06/20/2019  Mr. Lumley was observed post Covid-19 immunization for 30 minutes based on pre-vaccination screening without incident. He was provided with Vaccine Information Sheet and instruction to access the V-Safe system.   Mr. Skalski was instructed to call 911 with any severe reactions post vaccine: Marland Kitchen Difficulty breathing  . Swelling of face and throat  . A fast heartbeat  . A bad rash all over body  . Dizziness and weakness   Immunizations Administered    Name Date Dose VIS Date Route   Pfizer COVID-19 Vaccine 06/20/2019  8:21 AM 0.3 mL 04/11/2018 Intramuscular   Manufacturer: [employer], [employer]   Lot: [id number]   NDC: [address]-9

## 2019-06-22 ENCOUNTER — Ambulatory Visit: Payer: 59 | Admitting: Family Medicine

## 2019-07-06 ENCOUNTER — Other Ambulatory Visit: Payer: Self-pay | Admitting: Family Medicine

## 2019-07-06 DIAGNOSIS — J3089 Other allergic rhinitis: Secondary | ICD-10-CM

## 2019-07-06 DIAGNOSIS — J4540 Moderate persistent asthma, uncomplicated: Secondary | ICD-10-CM

## 2019-07-06 DIAGNOSIS — I10 Essential (primary) hypertension: Secondary | ICD-10-CM

## 2019-10-10 ENCOUNTER — Telehealth: Payer: Self-pay | Admitting: Family Medicine

## 2019-10-10 DIAGNOSIS — J302 Other seasonal allergic rhinitis: Secondary | ICD-10-CM

## 2019-10-10 DIAGNOSIS — I10 Essential (primary) hypertension: Secondary | ICD-10-CM

## 2019-10-10 DIAGNOSIS — J4540 Moderate persistent asthma, uncomplicated: Secondary | ICD-10-CM

## 2019-10-10 NOTE — Telephone Encounter (Signed)
Requested Prescriptions  Pending Prescriptions Disp Refills   olmesartan (BENICAR) 20 MG tablet [Pharmacy Med Name: Olmesartan Medoxomil 20 MG Oral Tablet] 90 tablet 0    Sig: Take 1 tablet by mouth once daily     Cardiovascular:  Angiotensin Receptor Blockers Failed - 10/10/2019 12:12 AM      Failed - Cr in normal range and within 180 days    Creat  Date Value Ref Range Status  11/30/2018 1.18 0.60 - 1.35 mg/dL Final         Failed - K in normal range and within 180 days    Potassium  Date Value Ref Range Status  11/30/2018 4.8 3.5 - 5.3 mmol/L Final         Failed - Valid encounter within last 6 months    Recent Outpatient Visits          6 months ago Dyslipidemia   [facility] Alba Cory, MD   10 months ago Essential hypertension   [facility] [place], Danna Hefty, MD   1 year ago Obesity, Class III, BMI 40-49.9 (morbid obesity) [facility])   [facility] Alba Cory, MD   1 year ago Chigger bites   [facility] [place], Lanora Manis E, NP   1 year ago Moderate persistent asthma without complication   [facility] [place], Danna Hefty, MD             Passed - Patient is not pregnant      Passed - Last BP in normal range    BP Readings from Last 1 Encounters:  03/23/19 128/82          montelukast (SINGULAIR) 10 MG tablet [Pharmacy Med Name: Montelukast Sodium 10 MG Oral Tablet] 90 tablet 0    Sig: Take 1 tablet by mouth once daily     Pulmonology:  Leukotriene Inhibitors Passed - 10/10/2019 12:12 AM      Passed - Valid encounter within last 12 months    Recent Outpatient Visits          6 months ago Dyslipidemia   [facility] Alba Cory, MD   10 months ago Essential hypertension   [facility] [place], Danna Hefty, MD   1 year ago Obesity, Class III, BMI 40-49.9 (morbid obesity) [facility])   [facility] Alba Cory, MD   1 year ago Chigger bites   [facility] Sharyon Cable E, NP   1 year ago Moderate persistent asthma without complication   [facility] Alba Cory, MD

## 2019-10-10 NOTE — Telephone Encounter (Signed)
Requested medications are due for refill today?  Yes  Requested medications are on active medication list?  Yes  Last Refill:   07/06/2019  # 90 with no refill.    Future visit scheduled?  No  Notes to Clinic:  Medication failed RX refill protocol due to no labs in the past 180 days.  Last labs were performed on 11/30/2018.

## 2019-10-11 NOTE — Telephone Encounter (Signed)
lvm for scheduling °

## 2019-12-09 ENCOUNTER — Other Ambulatory Visit: Payer: Self-pay | Admitting: Family Medicine

## 2019-12-09 DIAGNOSIS — I10 Essential (primary) hypertension: Secondary | ICD-10-CM

## 2019-12-09 NOTE — Telephone Encounter (Signed)
Requested Prescriptions  Pending Prescriptions Disp Refills   olmesartan (BENICAR) 20 MG tablet [Pharmacy Med Name: Olmesartan Medoxomil 20 MG Oral Tablet] 26 tablet 0    Sig: Take 1 tablet by mouth once daily     Cardiovascular:  Angiotensin Receptor Blockers Failed - 12/09/2019  1:18 PM      Failed - Cr in normal range and within 180 days    Creat  Date Value Ref Range Status  11/30/2018 1.18 0.60 - 1.35 mg/dL Final         Failed - K in normal range and within 180 days    Potassium  Date Value Ref Range Status  11/30/2018 4.8 3.5 - 5.3 mmol/L Final         Failed - Valid encounter within last 6 months    Recent Outpatient Visits          8 months ago Dyslipidemia   [facility] Alba Cory, MD   1 year ago Essential hypertension   [facility] [address] [facility] Alba Cory, MD   1 year ago Obesity, Class III, BMI 40-49.9 (morbid obesity) [facility])   [facility] Alba Cory, MD   2 years ago Chigger bites   [facility] [place], Lanora Manis E, NP   2 years ago Moderate persistent asthma without complication   [facility] Alba Cory, MD      Future Appointments            In 3 weeks Alba Cory, MD [facility]           Passed - Patient is not pregnant      Passed - Last BP in normal range    BP Readings from Last 1 Encounters:  03/23/19 128/82

## 2020-01-04 ENCOUNTER — Telehealth (INDEPENDENT_AMBULATORY_CARE_PROVIDER_SITE_OTHER): Payer: 59 | Admitting: Family Medicine

## 2020-01-04 ENCOUNTER — Encounter: Payer: Self-pay | Admitting: Family Medicine

## 2020-01-04 VITALS — Ht 67.0 in | Wt 272.0 lb

## 2020-01-04 DIAGNOSIS — E785 Hyperlipidemia, unspecified: Secondary | ICD-10-CM

## 2020-01-04 DIAGNOSIS — J3089 Other allergic rhinitis: Secondary | ICD-10-CM

## 2020-01-04 DIAGNOSIS — R7989 Other specified abnormal findings of blood chemistry: Secondary | ICD-10-CM

## 2020-01-04 DIAGNOSIS — E8881 Metabolic syndrome: Secondary | ICD-10-CM | POA: Diagnosis not present

## 2020-01-04 DIAGNOSIS — J4540 Moderate persistent asthma, uncomplicated: Secondary | ICD-10-CM

## 2020-01-04 DIAGNOSIS — I10 Essential (primary) hypertension: Secondary | ICD-10-CM | POA: Diagnosis not present

## 2020-01-04 DIAGNOSIS — N528 Other male erectile dysfunction: Secondary | ICD-10-CM

## 2020-01-04 DIAGNOSIS — N529 Male erectile dysfunction, unspecified: Secondary | ICD-10-CM

## 2020-01-04 DIAGNOSIS — E781 Pure hyperglyceridemia: Secondary | ICD-10-CM

## 2020-01-04 DIAGNOSIS — J302 Other seasonal allergic rhinitis: Secondary | ICD-10-CM

## 2020-01-04 MED ORDER — SILDENAFIL CITRATE 100 MG PO TABS: 50.0000 mg | ORAL_TABLET | Freq: Every day | ORAL | 1 refills | Status: DC | PRN

## 2020-01-04 MED ORDER — RYBELSUS 7 MG PO TABS: 1.0000 | ORAL_TABLET | Freq: Every morning | ORAL | 2 refills | Status: DC

## 2020-01-04 MED ORDER — OLMESARTAN MEDOXOMIL 40 MG PO TABS: 40.0000 mg | ORAL_TABLET | Freq: Every day | ORAL | 0 refills | Status: DC

## 2020-01-04 MED ORDER — BUDESONIDE-FORMOTEROL FUMARATE 160-4.5 MCG/ACT IN AERO: 2.0000 | INHALATION_SPRAY | Freq: Two times a day (BID) | RESPIRATORY_TRACT | 2 refills | Status: DC

## 2020-01-04 MED ORDER — ALBUTEROL SULFATE HFA 108 (90 BASE) MCG/ACT IN AERS: INHALATION_SPRAY | RESPIRATORY_TRACT | 0 refills | Status: DC

## 2020-01-04 NOTE — Progress Notes (Signed)
Name: Matthew Barrett   MRN: [id number]    DOB: [birth year]   Date:01/04/2020       Progress Note  Subjective  Chief Complaint  Chief Complaint  Patient presents with   Follow-up   Hypertension   Diabetes   Medication Refill    I connected with  [name]  on 01/04/20 at  2:20 PM EST by a video enabled telemedicine application and verified that I am speaking with the correct person using two identifiers.  I discussed the limitations of evaluation and management by telemedicine and the availability of in person appointments. The patient expressed understanding and agreed to proceed with the virtual visit  Staff also discussed with the patient that there may be a patient responsible charge related to this service. Patient Location: at work/break  Provider Location: [facility]   HPI  Asthma:  Currentlyusing Symbicort BID controls his cough and wheezing, he states since he has been wearing a mask daily at work his symptoms have been more controlled since not exposure as much dust at work.   HTN:  bp at home has been elevated, we will resume Benicar 40 mg daily and return for bp check within the next week . No chest pain, palpitation or sob  Hyperlipidemia:  The 10-year ASCVD risk score Denman George DC Jr., et al., 2013) is: 3.6%   Values used to calculate the score:     Age: 49 years     Sex: Male     Is Non-Hispanic African American: No     Diabetic: No     Tobacco smoker: No     Systolic Blood Pressure: 128 mmHg     Is BP treated: Yes     HDL Cholesterol: 47 mg/dL     Total Cholesterol: 215 mg/dL   MorbidObesity:he has tried Ozempic and Rybelsus, but not covered by insurance He has not been checking his weight at home, he states down to one can of soda daily, he states his clothes does not seem to be tight anymore   Metabolic syndrome:history of hypertension, elevated triglycerides, increase in abdominal girth and increase insulin resistance.He deniespolyphagia or  polydipsia.He was taking Ozempic, he prefers Rybelsus and explained it may not be covered by insurance, he could not tolerate Metformin in the past    Perennial Allergic Rhinitis  he statessymptoms are well controlled at this time, occasional sneezing, nasal congestion or rhinorrhea, but usually worse during the Summer when cutting the grass .   Low Testosterone and ED: He started viagra fall 2020 not on testosterone supplementation , he is trying to lose weight, he states viagra has improved ED    Patient Active Problem List   Diagnosis Date Noted   Tachycardia 08/08/2017   Hypertriglyceridemia 05/28/2016   Hypertension 11/24/2015   Perennial allergic rhinitis with seasonal variation 12/10/2014   Asthma, moderate persistent 12/10/2014   Dyslipidemia 12/10/2014   Obesity, Class III, BMI 40-49.9 (morbid obesity) (HCC) 12/10/2014    Past Surgical History:  Procedure Laterality Date   TONSILECTOMY/ADENOIDECTOMY WITH MYRINGOTOMY      Family History  Problem Relation Age of Onset   Hyperlipidemia Mother    Hypertension Mother    Hypothyroidism Mother    Heart block Mother    Benign prostatic hyperplasia Father    Prostatitis Father        Had prostate removed due to being enlarged   Hyperlipidemia Sister    Hypertension Sister    Asthma Sister    CAD Maternal Grandfather  Hypertension Maternal Grandfather    Psoriasis Maternal Grandfather    Dementia Maternal Grandfather    Congestive Heart Failure Paternal Grandfather    Diabetes Paternal Grandfather    Arthritis Paternal Grandfather    Kidney disease Paternal Grandfather    Diabetes Maternal Grandmother    Arthritis Maternal Grandmother    Diabetes Paternal Grandmother    Arthritis Paternal Grandmother    Hypertension Sister    Hyperlipidemia Sister    Asthma Sister     Social History   Socioeconomic History   Marital status: Married    Spouse name: Rinaldo Cloud   Number of  children: 0   Years of education: Not on file   Highest education level: 12th grade  Occupational History   Not on file  Tobacco Use   Smoking status: Never Smoker   Smokeless tobacco: Former Neurosurgeon    Types: Associate Professor Use: Never used  Substance and Sexual Activity   Alcohol use: No    Alcohol/week: 0.0 standard drinks   Drug use: No   Sexual activity: Yes    Partners: Female  Other Topics Concern   Not on file  Social History Narrative   Married, no children   Social Determinants of Health   Financial Resource Strain:    Difficulty of Paying Living Expenses: Not on file  Food Insecurity: No Food Insecurity   Worried About Programme researcher, broadcasting/film/video in the Last Year: Never true   Ran Out of Food in the Last Year: Never true  Transportation Needs: No Transportation Needs   Lack of Transportation (Medical): No   Lack of Transportation (Non-Medical): No  Physical Activity:    Days of Exercise per Week: Not on file   Minutes of Exercise per Session: Not on file  Stress:    Feeling of Stress : Not on file  Social Connections:    Frequency of Communication with Friends and Family: Not on file   Frequency of Social Gatherings with Friends and Family: Not on file   Attends Religious Services: Not on file   Active Member of Clubs or Organizations: Not on file   Attends Banker Meetings: Not on file   Marital Status: Not on file  Intimate Partner Violence:    Fear of Current or Ex-Partner: Not on file   Emotionally Abused: Not on file   Physically Abused: Not on file   Sexually Abused: Not on file     Current Outpatient Medications:    albuterol (VENTOLIN HFA) 108 (90 Base) MCG/ACT inhaler, INHALE 2 PUFFS BY MOUTH EVERY 6 HOURS AS NEEDED FOR WHEEZING AND FOR SHORTNESS OF BREATH, Disp: 18 g, Rfl: 0   azelastine (ASTELIN) 0.1 % nasal spray, Place 1 spray into both nostrils as needed., Disp: 30 mL, Rfl: 1    budesonide-formoterol (SYMBICORT) 160-4.5 MCG/ACT inhaler, Inhale 2 puffs into the lungs 2 (two) times daily., Disp: 1 Inhaler, Rfl: 2   cetirizine (ZYRTEC ALLERGY) 10 MG tablet, Take 10 mg by mouth daily., Disp: , Rfl:    Cholecalciferol (VITAMIN D) 50 MCG (2000 UT) CAPS, Take 1 capsule by mouth daily., Disp: , Rfl:    fluticasone (FLONASE) 50 MCG/ACT nasal spray, Place 2 sprays into both nostrils as needed for allergies., Disp: 16 g, Rfl: 1   loratadine (CLARITIN) 10 MG tablet, Take 10 mg by mouth daily as needed for allergies., Disp: , Rfl:    montelukast (SINGULAIR) 10 MG tablet, Take 1 tablet by mouth  once daily, Disp: 90 tablet, Rfl: 1   Multiple Vitamin (MULTI-VITAMIN) tablet, Take by mouth., Disp: , Rfl:    olmesartan (BENICAR) 20 MG tablet, Take 1 tablet by mouth once daily, Disp: 26 tablet, Rfl: 0   Semaglutide, 1 MG/DOSE, (OZEMPIC, 1 MG/DOSE,) 2 MG/1.5ML SOPN, Inject 1 mg into the skin once a week., Disp: 6 pen, Rfl: 1   sildenafil (VIAGRA) 100 MG tablet, Take 0.5-1 tablets (50-100 mg total) by mouth daily as needed for erectile dysfunction., Disp: 30 tablet, Rfl: 1  Allergies  Allergen Reactions   Aspirin    Tetanus Toxoids Other (See Comments)    Flared up Asthma    I personally reviewed active problem list, medication list, allergies, family history, social history, health maintenance with the patient/caregiver today.   ROS  Ten systems reviewed and is negative except as mentioned in HPI   Objective  Virtual encounter, vitals not obtained.  There is no height or weight on file to calculate BMI.  Physical Exam  Awake, alert and oriented   PHQ2/9: Depression screen [facility] 2/9 03/23/2019 11/30/2018 06/05/2018 12/08/2017 08/08/2017  Decreased Interest 0 0 0 0 0  Down, Depressed, Hopeless 0 0 0 0 0  PHQ - 2 Score 0 0 0 0 0  Altered sleeping 0 0 0 - -  Tired, decreased energy 0 0 0 - -  Change in appetite 0 0 0 - -  Feeling bad or failure about yourself  0 0 0 -  -  Trouble concentrating 0 0 0 - -  Moving slowly or fidgety/restless 0 0 0 - -  Suicidal thoughts 0 0 0 - -  PHQ-9 Score 0 0 0 - -  Difficult doing work/chores Not difficult at all Not difficult at all Not difficult at all - -   PHQ-2/9 Result is negative.    Fall Risk: Fall Risk  03/23/2019 11/30/2018 06/05/2018 12/08/2017 11/25/2017  Falls in the past year? 0 0 0 No No  Number falls in past yr: 0 0 0 - -  Injury with Fall? 0 0 0 - -  Comment - - - - -     Assessment & Plan  1. Essential hypertension  - CBC with Differential/Platelet - COMPLETE METABOLIC PANEL WITH GFR - olmesartan (BENICAR) 40 MG tablet; Take 1 tablet (40 mg total) by mouth daily.  Dispense: 90 tablet; Refill: 0  2. Metabolic syndrome  - Hemoglobin A1c - Semaglutide (RYBELSUS) 7 MG TABS; Take 1 tablet by mouth in the morning.  Dispense: 30 tablet; Refill: 2  3. Perennial allergic rhinitis with seasonal variation   4. Dyslipidemia   5. Moderate persistent asthma without complication  - budesonide-formoterol (SYMBICORT) 160-4.5 MCG/ACT inhaler; Inhale 2 puffs into the lungs 2 (two) times daily.  Dispense: 10.2 each; Refill: 2 - albuterol (VENTOLIN HFA) 108 (90 Base) MCG/ACT inhaler; INHALE 2 PUFFS BY MOUTH EVERY 6 HOURS AS NEEDED FOR WHEEZING AND FOR SHORTNESS OF BREATH  Dispense: 18 g; Refill: 0  6. Obesity, Class III, BMI 40-49.9 (morbid obesity) (HCC)  Discussed with the patient the risk posed by an increased BMI. Discussed importance of portion control, calorie counting and at least 150 minutes of physical activity weekly. Avoid sweet beverages and drink more water. Eat at least 6 servings of fruit and vegetables daily   7. Erectile dysfunction, unspecified erectile dysfunction type  - sildenafil (VIAGRA) 100 MG tablet; Take 0.5-1 tablets (50-100 mg total) by mouth daily as needed for erectile dysfunction.  Dispense: 30  tablet; Refill: 1  8. Hypertriglyceridemia  - Lipid panel  9. Other male  erectile dysfunction   10. Low testosterone in male  - Testosterone Total,Free,Bio, Males  I discussed the assessment and treatment plan with the patient. The patient was provided an opportunity to ask questions and all were answered. The patient agreed with the plan and demonstrated an understanding of the instructions.  The patient was advised to call back or seek an in-person evaluation if the symptoms worsen or if the condition fails to improve as anticipated.  I provided 25  minutes of non-face-to-face time during this encounter.

## 2020-01-09 ENCOUNTER — Ambulatory Visit (INDEPENDENT_AMBULATORY_CARE_PROVIDER_SITE_OTHER): Payer: 59

## 2020-01-09 ENCOUNTER — Other Ambulatory Visit: Payer: Self-pay

## 2020-01-09 VITALS — BP 140/98

## 2020-01-09 DIAGNOSIS — Z23 Encounter for immunization: Secondary | ICD-10-CM | POA: Diagnosis not present

## 2020-01-09 DIAGNOSIS — Z0130 Encounter for examination of blood pressure without abnormal findings: Secondary | ICD-10-CM

## 2020-01-11 LAB — COMPLETE METABOLIC PANEL WITH GFR
AG Ratio: 1.8 (calc) (ref 1.0–2.5)
ALT: 41 U/L (ref 9–46)
AST: 31 U/L (ref 10–40)
Albumin: 4.5 g/dL (ref 3.6–5.1)
Alkaline phosphatase (APISO): 81 U/L (ref 36–130)
BUN: 17 mg/dL (ref 7–25)
CO2: 28 mmol/L (ref 20–32)
Calcium: 9.9 mg/dL (ref 8.6–10.3)
Chloride: 100 mmol/L (ref 98–110)
Creat: 1.19 mg/dL (ref 0.60–1.35)
GFR, Est African American: 83 mL/min/{1.73_m2} (ref >=60)
GFR, Est Non African American: 72 mL/min/{1.73_m2} (ref >=60)
Globulin: 2.5 g/dL (calc) (ref 1.9–3.7)
Glucose, Bld: 105 mg/dL — ABNORMAL HIGH (ref 65–99)
Potassium: 4.4 mmol/L (ref 3.5–5.3)
Sodium: 137 mmol/L (ref 135–146)
Total Bilirubin: 0.6 mg/dL (ref 0.2–1.2)
Total Protein: 7 g/dL (ref 6.1–8.1)

## 2020-01-11 LAB — LIPID PANEL
Cholesterol: 199 mg/dL (ref <200)
HDL: 44 mg/dL (ref >=40)
LDL Cholesterol (Calc): 126 mg/dL (calc) — ABNORMAL HIGH
Non-HDL Cholesterol (Calc): 155 mg/dL (calc) — ABNORMAL HIGH (ref <130)
Total CHOL/HDL Ratio: 4.5 (calc) (ref <5.0)
Triglycerides: 170 mg/dL — ABNORMAL HIGH (ref <150)

## 2020-01-11 LAB — CBC WITH DIFFERENTIAL/PLATELET
Absolute Monocytes: 518 cells/uL (ref 200–950)
Basophils Absolute: 32 cells/uL (ref 0–200)
Basophils Relative: 0.5 %
Eosinophils Absolute: 122 cells/uL (ref 15–500)
Eosinophils Relative: 1.9 %
HCT: 46.2 % (ref 38.5–50.0)
Hemoglobin: 15.4 g/dL (ref 13.2–17.1)
Lymphs Abs: 1965 cells/uL (ref 850–3900)
MCH: 30 pg (ref 27.0–33.0)
MCHC: 33.3 g/dL (ref 32.0–36.0)
MCV: 90.1 fL (ref 80.0–100.0)
MPV: 10.3 fL (ref 7.5–12.5)
Monocytes Relative: 8.1 %
Neutro Abs: 3763 cells/uL (ref 1500–7800)
Neutrophils Relative %: 58.8 %
Platelets: 355 10*3/uL (ref 140–400)
RBC: 5.13 10*6/uL (ref 4.20–5.80)
RDW: 12.4 % (ref 11.0–15.0)
Total Lymphocyte: 30.7 %
WBC: 6.4 10*3/uL (ref 3.8–10.8)

## 2020-01-11 LAB — TESTOSTERONE TOTAL,FREE,BIO, MALES
Albumin: 4.5 g/dL (ref 3.6–5.1)
Sex Hormone Binding: 6 nmol/L — ABNORMAL LOW (ref 10–50)
Testosterone: 172 ng/dL — ABNORMAL LOW (ref 250–827)

## 2020-01-11 LAB — HEMOGLOBIN A1C
Hgb A1c MFr Bld: 5.5 % of total Hgb (ref <5.7)
Mean Plasma Glucose: 111 (calc)
eAG (mmol/L): 6.2 (calc)

## 2020-03-14 ENCOUNTER — Other Ambulatory Visit
Admission: RE | Admit: 2020-03-14 | Discharge: 2020-03-14 | Disposition: A | Discharge status: Home / Self Care | Payer: 59 | Source: Ambulatory Visit | Attending: Student | Admitting: Student

## 2020-03-14 DIAGNOSIS — R000 Tachycardia, unspecified: Secondary | ICD-10-CM | POA: Insufficient documentation

## 2020-03-14 DIAGNOSIS — Z03818 Encounter for observation for suspected exposure to other biological agents ruled out: Secondary | ICD-10-CM | POA: Diagnosis not present

## 2020-03-14 DIAGNOSIS — U071 COVID-19: Secondary | ICD-10-CM | POA: Diagnosis not present

## 2020-03-14 DIAGNOSIS — R0602 Shortness of breath: Secondary | ICD-10-CM | POA: Insufficient documentation

## 2020-03-14 LAB — FIBRIN DERIVATIVES D-DIMER (ARMC ONLY): Fibrin derivatives D-dimer (ARMC): 159.2 ng/mL (FEU) (ref 0.00–499.00)

## 2020-04-07 NOTE — Progress Notes (Signed)
Name: Matthew Barrett   MRN: [id number]    DOB: [birth year]   Date:04/08/2020       Progress Note  Subjective  Chief Complaint  Follow Up  HPI  Asthma:  Currentlyusing Symbicort BID controls his cough and wheezing since COVID he has been feeling tired and also has intermittent SOB, he states since he has been wearing a mask daily at work his symptoms have been more controlled since not exposure as much dust at work.   HTN:  bp at home has been well controlled at home lately, today is slightly elevated , we will continue current dose of medication. No chest pain but since COVID-19 he has intermittent palpitation    Hyperlipidemia:   The 10-year ASCVD risk score Denman George DC Jr., et al., 2013) is: 4.3%   Values used to calculate the score:     Age: 50 years     Sex: Male     Is Non-Hispanic African American: No     Diabetic: No     Tobacco smoker: No     Systolic Blood Pressure: 138 mmHg     Is BP treated: Yes     HDL Cholesterol: 44 mg/dL     Total Cholesterol: 199 mg/dL  MorbidObesity:he has tried Ozempic and Rybelsus, but not covered by insurance He has not been checking his weight at home, he completely stopped drinking sodas. 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 He alternates otc medications  Low Testosterone and ED : He started viagra fall 2020 not on testosterone supplementation , he is trying to lose weight, he states viagra has improved ED , we will continue current regiment                    COVID-19 history : diagnosed on 03/04/2020 at [facility] treated with doxy, prednisone and                       codeine and guanfenicin, he had to go back to have a negative test prior to going back to work, repeat test was              negative, he was diagnosed with bronchitis and was given antibiotics  Steroids. His heart rate has been elevated            since, we will check EKG and TSH today      Patient Active Problem List   Diagnosis Date Noted  . Tachycardia 08/08/2017  . Hypertriglyceridemia 05/28/2016  . Hypertension 11/24/2015  . Perennial allergic rhinitis with seasonal variation 12/10/2014  . Asthma, moderate persistent 12/10/2014  . Dyslipidemia 12/10/2014  . Obesity, Class III, BMI 40-49.9 (morbid obesity) (HCC) 12/10/2014    Past Surgical History:  Procedure Laterality Date  . TONSILECTOMY/ADENOIDECTOMY WITH MYRINGOTOMY      Family History  Problem Relation Age of Onset  . Hyperlipidemia Mother   . Hypertension Mother   . Hypothyroidism Mother   . Heart block Mother   . Benign prostatic hyperplasia Father   . Prostatitis Father  Had prostate removed due to being enlarged  . Hyperlipidemia Sister   . Hypertension Sister   . Asthma Sister   . CAD Maternal Grandfather   . Hypertension Maternal Grandfather   . Psoriasis Maternal Grandfather   . Dementia Maternal Grandfather   . Congestive Heart Failure Paternal Grandfather   . Diabetes Paternal Grandfather   . Arthritis Paternal Grandfather   . Kidney disease Paternal Grandfather   . Diabetes Maternal Grandmother   . Arthritis Maternal Grandmother   . Diabetes Paternal Grandmother   . Arthritis Paternal Grandmother   . Hypertension Sister   . Hyperlipidemia Sister   . Asthma Sister     Social History   Tobacco Use  . Smoking status: Never Smoker  . Smokeless tobacco: Former Neurosurgeon    Types: Chew  Substance Use Topics  . Alcohol use: No    Alcohol/week: 0.0 standard drinks      Current Outpatient Medications:  .  albuterol (VENTOLIN HFA) 108 (90 Base) MCG/ACT inhaler, INHALE 2 PUFFS BY MOUTH EVERY 6 HOURS AS NEEDED FOR WHEEZING AND FOR SHORTNESS OF BREATH, Disp: 18 g, Rfl: 0 .  azelastine (ASTELIN) 0.1 % nasal spray, Place 1 spray into both nostrils as needed., Disp: 30 mL, Rfl: 1 .  budesonide-formoterol (SYMBICORT) 160-4.5 MCG/ACT inhaler, Inhale 2 puffs into the lungs 2 (two) times daily., Disp: 10.2 each, Rfl: 2 .  cetirizine (ZYRTEC) 10 MG tablet, Take 10 mg by mouth daily., Disp: , Rfl:  .  fluticasone (FLONASE) 50 MCG/ACT nasal spray, Place 2 sprays into both nostrils as needed for allergies., Disp: 16 g, Rfl: 1 .  loratadine (CLARITIN) 10 MG tablet, Take 10 mg by mouth daily as needed for allergies., Disp: , Rfl:  .  montelukast (SINGULAIR) 10 MG tablet, Take 1 tablet by mouth once daily, Disp: 90 tablet, Rfl: 1 .  Multiple Vitamin (MULTI-VITAMIN) tablet, Take by mouth., Disp: , Rfl:  .  olmesartan (BENICAR) 40 MG tablet, Take 1 tablet (40 mg total) by mouth daily., Disp: 90 tablet, Rfl: 0 .  Semaglutide (RYBELSUS) 7 MG TABS, Take 1 tablet by mouth in the morning., Disp: 30 tablet, Rfl: 2 .  sildenafil (VIAGRA) 100 MG tablet, Take 0.5-1 tablets (50-100 mg total) by mouth daily as needed for erectile dysfunction., Disp: 30 tablet, Rfl: 1 .  Cholecalciferol (VITAMIN D) 50 MCG (2000 UT) CAPS, Take 1 capsule by mouth daily. (Patient not taking: Reported on 04/08/2020), Disp: , Rfl:   Allergies  Allergen Reactions  . Aspirin   . Tetanus Toxoids Other (See Comments)    Flared up Asthma    I personally reviewed active problem list, medication list, allergies, family history, social history, health maintenance with the patient/caregiver today.   ROS  Constitutional: Negative for fever, positive for  weight change.  Respiratory: Negative for cough , positive for intermittent  shortness of breath.   Cardiovascular: Negative for chest pain , he has  intermittent palpitations.  Gastrointestinal: Negative for abdominal pain, no bowel changes.  Musculoskeletal: Negative for gait problem or joint swelling.  Skin: Negative for rash.  Neurological: Negative for dizziness or headache.  No other specific complaints in a complete review of systems (except as listed in HPI above).  Objective  Vitals:   04/08/20 1501  BP: 138/82  Pulse: (!) 107  Resp: 16  Temp: 98.1 F (36.7 C)  TempSrc: Oral  SpO2: 99%  Weight: 279 lb (126.6 kg)  Height: 5\' 7"  (1.702 m)    Body  mass index is 43.7 kg/m.  Physical Exam  Constitutional: Patient appears well-developed and well-nourished. Obese No distress.  HEENT: head atraumatic, normocephalic, pupils equal and reactive to light, neck supple Cardiovascular: tachycardia , regular rhythm and normal heart sounds.  No murmur heard. No BLE edema. Pulmonary/Chest: Effort normal and breath sounds normal. No respiratory distress. Abdominal: Soft.  There is no tenderness. Psychiatric: Patient has a normal mood and affect. behavior is normal. Judgment and thought content normal.  Recent Results (from the past 2160 hour(s))  Fibrin derivatives D-Dimer ([facility] only)     Status: None   Collection Time: 03/14/20 11:47 AM  Result Value Ref Range   Fibrin derivatives D-dimer ([facility]) 159.20 0.00 - 499.00 ng/mL (FEU)    Comment: (NOTE) <> Exclusion of Venous Thromboembolism (VTE) - OUTPATIENT ONLY   (Emergency Department or [place])    0-499 ng/ml (FEU): With a low to intermediate pretest probability                      for VTE this test result excludes the diagnosis                      of VTE.   >499 ng/ml (FEU) : VTE not excluded; additional work up for VTE is                      required.  <> Testing on Inpatients and Evaluation of Disseminated Intravascular   Coagulation (DIC) Reference Range:   0-499 ng/ml (FEU) Performed at [facility], [address] [place], [place] [address]        PHQ2/9: Depression screen [facility] 2/9 04/08/2020 01/04/2020 03/23/2019 11/30/2018 06/05/2018  Decreased Interest 0 0 0 0 0  Down, Depressed, Hopeless 0 0 0 0 0  PHQ - 2 Score 0 0 0 0 0  Altered sleeping - - 0 0 0  Tired, decreased energy - - 0 0 0  Change in appetite - - 0 0 0  Feeling bad or failure about yourself  - - 0 0 0  Trouble concentrating - - 0 0 0  Moving slowly or fidgety/restless - - 0 0 0  Suicidal thoughts - - 0 0 0  PHQ-9 Score - - 0 0 0  Difficult doing work/chores - - Not difficult at all Not difficult at all Not difficult at all    phq 9 is negative   Fall Risk: Fall Risk  04/08/2020 01/04/2020 03/23/2019 11/30/2018 06/05/2018  Falls in the past year? 0 0 0 0 0  Number falls in past yr: 0 0 0 0 0  Injury with Fall? 0 0 0 0 0  Comment - - - - -    Functional Status Survey: Is the patient deaf or have difficulty hearing?: Yes Does the patient have difficulty seeing, even when wearing glasses/contacts?: No Does the patient have difficulty concentrating, remembering, or making decisions?: No Does the patient have difficulty walking or climbing stairs?: No Does the patient have difficulty dressing or bathing?: No Does the patient have difficulty doing errands alone such as visiting a doctor's office or shopping?: No    Assessment & Plan  1. Tachycardia  - TSH  2. Obesity, Class III, BMI 40-49.9 (morbid obesity) (HCC)  Discussed with the patient the risk posed by an increased BMI. Discussed importance of portion control, calorie counting and at least 150 minutes of physical activity weekly. Avoid sweet beverages and drink more  water. Eat at least 6 servings of fruit and vegetables daily   3. Metabolic syndrome  - Semaglutide (RYBELSUS) 7 MG TABS; Take 1 tablet by mouth in the morning.  Dispense: 90 tablet; Refill: 0  4. Moderate persistent asthma without complication  - montelukast (SINGULAIR) 10 MG tablet; Take 1 tablet (10 mg total) by mouth daily.   Dispense: 90 tablet; Refill: 1 - budesonide-formoterol (SYMBICORT) 160-4.5 MCG/ACT inhaler; Inhale 2 puffs into the lungs 2 (two) times daily.  Dispense: 30.6 each; Refill: 1 - albuterol (VENTOLIN HFA) 108 (90 Base) MCG/ACT inhaler; INHALE 2 PUFFS BY MOUTH EVERY 6 HOURS AS NEEDED FOR WHEEZING AND FOR SHORTNESS OF BREATH  Dispense: 18 g; Refill: 0  5. Essential hypertension  - olmesartan (BENICAR) 40 MG tablet; Take 1 tablet (40 mg total) by mouth daily.  Dispense: 90 tablet; Refill: 1  6. Dyslipidemia   7. 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 We will refer him to cardiologist first   8. Perennial allergic rhinitis with seasonal variation  - montelukast (SINGULAIR) 10 MG tablet; Take 1 tablet (10 mg total) by mouth daily.  Dispense: 90 tablet; Refill: 1  9. Erectile dysfunction, unspecified erectile dysfunction type   10. Abnormal EKG  - Ambulatory referral to Cardiology

## 2020-04-08 ENCOUNTER — Other Ambulatory Visit: Payer: Self-pay

## 2020-04-08 ENCOUNTER — Ambulatory Visit: Payer: 59 | Admitting: Family Medicine

## 2020-04-08 ENCOUNTER — Encounter: Payer: Self-pay | Admitting: Family Medicine

## 2020-04-08 VITALS — BP 138/82 | HR 107 | Temp 98.1°F | Resp 16 | Ht 67.0 in | Wt 279.0 lb

## 2020-04-08 DIAGNOSIS — J3089 Other allergic rhinitis: Secondary | ICD-10-CM

## 2020-04-08 DIAGNOSIS — R000 Tachycardia, unspecified: Secondary | ICD-10-CM | POA: Diagnosis not present

## 2020-04-08 DIAGNOSIS — I10 Essential (primary) hypertension: Secondary | ICD-10-CM

## 2020-04-08 DIAGNOSIS — R9431 Abnormal electrocardiogram [ECG] [EKG]: Secondary | ICD-10-CM

## 2020-04-08 DIAGNOSIS — E785 Hyperlipidemia, unspecified: Secondary | ICD-10-CM

## 2020-04-08 DIAGNOSIS — E8881 Metabolic syndrome: Secondary | ICD-10-CM

## 2020-04-08 DIAGNOSIS — N529 Male erectile dysfunction, unspecified: Secondary | ICD-10-CM

## 2020-04-08 DIAGNOSIS — U099 Post covid-19 condition, unspecified: Secondary | ICD-10-CM

## 2020-04-08 DIAGNOSIS — J4540 Moderate persistent asthma, uncomplicated: Secondary | ICD-10-CM | POA: Diagnosis not present

## 2020-04-08 DIAGNOSIS — E66813 Obesity, class 3: Secondary | ICD-10-CM

## 2020-04-08 DIAGNOSIS — J302 Other seasonal allergic rhinitis: Secondary | ICD-10-CM

## 2020-04-08 LAB — TSH: TSH: 1.57 mIU/L (ref 0.40–4.50)

## 2020-04-08 MED ORDER — RYBELSUS 7 MG PO TABS: 1.0000 | ORAL_TABLET | Freq: Every morning | ORAL | 0 refills | Status: DC

## 2020-04-08 MED ORDER — MONTELUKAST SODIUM 10 MG PO TABS: 10.0000 mg | ORAL_TABLET | Freq: Every day | ORAL | 1 refills | Status: DC

## 2020-04-08 MED ORDER — BUDESONIDE-FORMOTEROL FUMARATE 160-4.5 MCG/ACT IN AERO
2.0000 | INHALATION_SPRAY | Freq: Two times a day (BID) | RESPIRATORY_TRACT | 1 refills | Status: DC
Start: 2020-04-08 — End: 2020-12-22

## 2020-04-08 MED ORDER — ALBUTEROL SULFATE HFA 108 (90 BASE) MCG/ACT IN AERS: INHALATION_SPRAY | RESPIRATORY_TRACT | 0 refills | Status: DC

## 2020-04-08 MED ORDER — OLMESARTAN MEDOXOMIL 40 MG PO TABS: 40.0000 mg | ORAL_TABLET | Freq: Every day | ORAL | 1 refills | Status: DC

## 2020-04-15 ENCOUNTER — Ambulatory Visit: Payer: 59 | Admitting: Family Medicine

## 2020-04-16 NOTE — Progress Notes (Signed)
Bp check 

## 2020-04-21 ENCOUNTER — Ambulatory Visit (INDEPENDENT_AMBULATORY_CARE_PROVIDER_SITE_OTHER): Payer: 59 | Admitting: Cardiology

## 2020-04-21 ENCOUNTER — Other Ambulatory Visit: Payer: Self-pay

## 2020-04-21 ENCOUNTER — Encounter: Payer: Self-pay | Admitting: Cardiology

## 2020-04-21 VITALS — BP 128/92 | HR 93 | Ht 67.0 in | Wt 283.2 lb

## 2020-04-21 DIAGNOSIS — E7800 Pure hypercholesterolemia, unspecified: Secondary | ICD-10-CM

## 2020-04-21 DIAGNOSIS — R000 Tachycardia, unspecified: Secondary | ICD-10-CM | POA: Diagnosis not present

## 2020-04-21 DIAGNOSIS — R0602 Shortness of breath: Secondary | ICD-10-CM

## 2020-04-21 DIAGNOSIS — I10 Essential (primary) hypertension: Secondary | ICD-10-CM

## 2020-04-21 NOTE — Progress Notes (Signed)
Cardiology Office Note:    Date:  04/21/2020   ID:  Matthew Barrett, DOB March 08, 1970, MRN [id number]  PCP:  Alba Cory, MD   [facility]  Cardiologist:  Debbe Odea, MD  Advanced Practice Provider:  No care team member to display Electrophysiologist:  None       Referring MD: Alba Cory, MD   Chief Complaint  Patient presents with  . New Patient (Initial Visit)    Patient c/o rapid heart beats for 5-8 years, since Covid, the symptoms are much worse. Medications reviewed by the patient verbally.    Matthew Barrett is a 50 y.o. male who is being seen today for the evaluation of elevated heart rates at the request of Alba Cory, MD.   History of Present Illness:    Matthew Barrett is a 50 y.o. male with a hx of asthma, hypertension, hyperlipidemia who presents due to rapid heartbeats.  Patient was diagnosed with COVID-19 2 months ago after having symptoms of fatigue, headaches.  He did notice his heart rate was elevated when he had Covid, up to 113 bpm.  He denies chest pain, palpitations.  His baseline heart rate is in the 90s.  Since then, his heart rates have been within normal range of less than 100.  States having shortness of breath and fatigue which is gradually improving.  He works in Scientist, water quality, noticed he is not able to do what he used to do for mild exertional standpoint, but all this is slowly improving.  Denies smoking, denies history of heart disease.  Past Medical History:  Diagnosis Date  . Allergy   . Asthma   . Elevated blood pressure   . Hyperlipidemia   . Obesity   . Tachycardia     Past Surgical History:  Procedure Laterality Date  . TONSILECTOMY/ADENOIDECTOMY WITH MYRINGOTOMY      Current Medications: Current Meds  Medication Sig  . albuterol (VENTOLIN HFA) 108 (90 Base) MCG/ACT inhaler INHALE 2 PUFFS BY MOUTH EVERY 6 HOURS AS NEEDED FOR WHEEZING AND FOR SHORTNESS OF BREATH  . Alpha Lipoic Acid 200 MG CAPS Take  by mouth daily.  . APPLE CIDER VINEGAR PO Take 480 mg by mouth daily.  Marland Kitchen azelastine (ASTELIN) 0.1 % nasal spray Place 1 spray into both nostrils as needed.  . budesonide-formoterol (SYMBICORT) 160-4.5 MCG/ACT inhaler Inhale 2 puffs into the lungs 2 (two) times daily.  . cetirizine (ZYRTEC) 10 MG tablet Take 10 mg by mouth daily.  . Cholecalciferol (VITAMIN D) 50 MCG (2000 UT) CAPS Take 1 capsule by mouth daily.  . Cyanocobalamin (VITAMIN B 12 PO) Take 1,000 mg by mouth daily.  . fluticasone (FLONASE) 50 MCG/ACT nasal spray Place 2 sprays into both nostrils as needed for allergies.  Marland Kitchen loratadine (CLARITIN) 10 MG tablet Take 10 mg by mouth daily as needed for allergies.  . montelukast (SINGULAIR) 10 MG tablet Take 1 tablet (10 mg total) by mouth daily.  . Multiple Vitamin (MULTI-VITAMIN) tablet Take by mouth.  . olmesartan (BENICAR) 40 MG tablet Take 1 tablet (40 mg total) by mouth daily.  . Semaglutide (RYBELSUS) 7 MG TABS Take 1 tablet by mouth in the morning.  . sildenafil (VIAGRA) 100 MG tablet Take 0.5-1 tablets (50-100 mg total) by mouth daily as needed for erectile dysfunction.     Allergies:   Aspirin and Tetanus toxoids   Social History   Socioeconomic History  . Marital status: Married    Spouse name: Rinaldo Cloud  .  Number of children: 0  . Years of education: Not on file  . Highest education level: 12th grade  Occupational History  . Not on file  Tobacco Use  . Smoking status: Never Smoker  . Smokeless tobacco: Former Neurosurgeon    Types: Engineer, drilling  . Vaping Use: Never used  Substance and Sexual Activity  . Alcohol use: No    Alcohol/week: 0.0 standard drinks  . Drug use: No  . Sexual activity: Yes    Partners: Female  Other Topics Concern  . Not on file  Social History Narrative   Married, no children   Social Determinants of Health   Financial Resource Strain: Not on file  Food Insecurity: Not on file  Transportation Needs: Not on file  Physical Activity: Not  on file  Stress: Not on file  Social Connections: Not on file     Family History: The patient's family history includes Arthritis in his maternal grandmother, paternal grandfather, and paternal grandmother; Asthma in his sister and sister; Benign prostatic hyperplasia in his father; CAD in his maternal grandfather; Congestive Heart Failure in his paternal grandfather; Dementia in his maternal grandfather; Diabetes in his maternal grandmother, paternal grandfather, and paternal grandmother; Heart block in his mother; Hyperlipidemia in his mother, sister, and sister; Hypertension in his maternal grandfather, mother, sister, and sister; Hypothyroidism in his mother; Kidney disease in his paternal grandfather; Prostatitis in his father; Psoriasis in his maternal grandfather.  ROS:   Please see the history of present illness.     All other systems reviewed and are negative.  EKGs/Labs/Other Studies Reviewed:    The following studies were reviewed today:   EKG:  EKG is  ordered today.  The ekg ordered today demonstrates normal sinus rhythm, normal ECG  Recent Labs: 01/09/2020: ALT 41; BUN 17; Creat 1.19; Hemoglobin 15.4; Platelets 355; Potassium 4.4; Sodium 137 04/08/2020: TSH 1.57  Recent Lipid Panel    Component Value Date/Time   CHOL 199 01/09/2020 0955   TRIG 170 (H) 01/09/2020 0955   HDL 44 01/09/2020 0955   CHOLHDL 4.5 01/09/2020 0955   VLDL 18 09/21/2016 1208   LDLCALC 126 (H) 01/09/2020 0955     Risk Assessment/Calculations:      Physical Exam:    VS:  BP (!) 128/92 (BP Location: Left Arm, Patient Position: Sitting, Cuff Size: Large)   Pulse 93   Ht 5\' 7"  (1.702 m)   Wt 283 lb 4 oz (128.5 kg)   SpO2 99%   BMI 44.36 kg/m     Wt Readings from Last 3 Encounters:  04/21/20 283 lb 4 oz (128.5 kg)  04/08/20 279 lb (126.6 kg)  01/04/20 272 lb (123.4 kg)     GEN:  Well nourished, well developed in no acute distress HEENT: Normal NECK: No JVD; No carotid  bruits LYMPHATICS: No lymphadenopathy CARDIAC: RRR, no murmurs, rubs, gallops RESPIRATORY:  Clear to auscultation without rales, wheezing or rhonchi  ABDOMEN: Soft, non-tender, non-distended MUSCULOSKELETAL:  No edema; No deformity  SKIN: Warm and dry NEUROLOGIC:  Alert and oriented x 3 PSYCHIATRIC:  Normal affect   ASSESSMENT:    1. SOB (shortness of breath)   2. Primary hypertension   3. Pure hypercholesterolemia   4. Tachycardia    PLAN:    In order of problems listed above:  1. Patient with shortness of breath, likely from lingering COVID-19 effects, deconditioning.  I do not believe this is an anginal equivalent.  Will get echocardiogram to evaluate  any structural abnormalities. 2. Hypertension, BP controlled.  Continue Benicar. 3. Hyperlipidemia, 10-year ASCVD risk 3.8%.  Low-cholesterol diet advised.  Patient not in statin benefit group. 4. Nonspecific tachycardia up to 113 bpm.  Heart rates predominantly have been in the normal range of less than 100.  Patient denies palpitations, EKG today also normal.  No indication for cardiac monitor.  Patient made aware, reassured  Follow-up after echocardiogram     Medication Adjustments/Labs and Tests Ordered: Current medicines are reviewed at length with the patient today.  Concerns regarding medicines are outlined above.  Orders Placed This Encounter  Procedures  . EKG 12-Lead  . ECHOCARDIOGRAM COMPLETE   No orders of the defined types were placed in this encounter.   Patient Instructions  Medication Instructions:   Your physician recommends that you continue on your current medications as directed. Please refer to the Current Medication list given to you today.  *If you need a refill on your cardiac medications before your next appointment, please call your pharmacy*   Lab Work: None ordered If you have labs (blood work) drawn today and your tests are completely normal, you will receive your results only  by: Marland Kitchen MyChart Message (if you have MyChart) OR . A paper copy in the mail If you have any lab test that is abnormal or we need to change your treatment, we will call you to review the results.   Testing/Procedures:  1.  Your physician has requested that you have an echocardiogram. Echocardiography is a painless test that uses sound waves to create images of your heart. It provides your doctor with information about the size and shape of your heart and how well your heart's chambers and valves are working. This procedure takes approximately one hour. There are no restrictions for this procedure.   Follow-Up: At [facility], you and your health needs are our priority.  As part of our continuing mission to provide you with exceptional heart care, we have created designated Provider Care Teams.  These Care Teams include your primary Cardiologist (physician) and Advanced Practice Providers (APPs -  Physician Assistants and Nurse Practitioners) who all work together to provide you with the care you need, when you need it.  We recommend signing up for the patient portal called "MyChart".  Sign up information is provided on this After Visit Summary.  MyChart is used to connect with patients for Virtual Visits (Telemedicine).  Patients are able to view lab/test results, encounter notes, upcoming appointments, etc.  Non-urgent messages can be sent to your provider as well.   To learn more about what you can do with MyChart, go to [email or web address].    Your next appointment:   Follow up after Echo   The format for your next appointment:   In Person  Provider:   Debbe Odea, MD   Other Instructions      Signed, Debbe Odea, MD  04/21/2020 11:56 AM    [facility]

## 2020-04-21 NOTE — Patient Instructions (Signed)

## 2020-05-08 ENCOUNTER — Other Ambulatory Visit: Payer: 59

## 2020-05-20 ENCOUNTER — Ambulatory Visit: Payer: 59 | Admitting: Cardiology

## 2020-06-03 ENCOUNTER — Other Ambulatory Visit: Payer: 59

## 2020-06-06 ENCOUNTER — Ambulatory Visit: Payer: 59 | Admitting: Cardiology

## 2020-06-30 ENCOUNTER — Other Ambulatory Visit: Payer: 59

## 2020-07-07 NOTE — Progress Notes (Deleted)
Name: Matthew Barrett   MRN: [id number]    DOB: 1970-11-20   Date:07/07/2020       Progress Note  Subjective  Chief Complaint  Follow Up  HPI  Asthma:  Currentlyusing Symbicort BID controls his cough and wheezing since COVID he has been feeling tired and also has intermittent SOB, he states since he has been wearing a mask daily at work his symptoms have been more controlled since not exposure as much dust at work.   HTN:  bp at home has been well controlled at home lately, today is slightly elevated , we will continue current dose of medication. No chest pain but since COVID-19 he has intermittent palpitation    Hyperlipidemia:   The 10-year ASCVD risk score Denman George DC Jr., et al., 2013) is: 4.3%   Values used to calculate the score:     Age: 50 years     Sex: Male     Is Non-Hispanic African American: No     Diabetic: No     Tobacco smoker: No     Systolic Blood Pressure: 138 mmHg     Is BP treated: Yes     HDL Cholesterol: 44 mg/dL     Total Cholesterol: 199 mg/dL  MorbidObesity:he has tried Ozempic and Rybelsus, but not covered by insurance He has not been checking his weight at home, he completely stopped drinking sodas. 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 He alternates otc medications  Low Testosterone and ED : He started viagra fall 2020 not on testosterone supplementation , he is trying to lose weight, he states viagra has improved ED , we will continue current regiment                    COVID-19 history : diagnosed on 03/04/2020 at [facility] treated with doxy, prednisone and codeine and guanfenicin, he had to go back to have a negative test prior to going back to work, repeat test was negative, he was diagnosed with bronchitis and was given antibiotics  Steroids. His heart rate has been elevated since, we will check EKG and TSH today    Patient Active Problem List   Diagnosis Date Noted  . Tachycardia 08/08/2017  . Hypertriglyceridemia 05/28/2016  . Hypertension 11/24/2015  . Perennial allergic rhinitis with seasonal variation 12/10/2014  . Asthma, moderate persistent 12/10/2014  . Dyslipidemia 12/10/2014  . Obesity, Class III, BMI 40-49.9 (morbid obesity) (HCC) 12/10/2014    Past Surgical History:  Procedure Laterality Date  . TONSILECTOMY/ADENOIDECTOMY WITH MYRINGOTOMY      Family History  Problem Relation Age of Onset  . Hyperlipidemia Mother   . Hypertension Mother   . Hypothyroidism Mother   . Heart block Mother   . Benign prostatic hyperplasia Father   . Prostatitis Father        Had prostate removed due to being enlarged  . Hyperlipidemia Sister   . Hypertension Sister   . Asthma Sister   . CAD Maternal Grandfather   . Hypertension Maternal Grandfather   . Psoriasis Maternal Grandfather   .  Dementia Maternal Grandfather   . Congestive Heart Failure Paternal Grandfather   . Diabetes Paternal Grandfather   . Arthritis Paternal Grandfather   . Kidney disease Paternal Grandfather   . Diabetes Maternal Grandmother   . Arthritis Maternal Grandmother   . Diabetes Paternal Grandmother   . Arthritis Paternal Grandmother   . Hypertension Sister   . Hyperlipidemia Sister   . Asthma Sister     Social History   Tobacco Use  . Smoking status: Never Smoker  . Smokeless tobacco: Former Neurosurgeon    Types: Chew  Substance Use Topics  . Alcohol use: No    Alcohol/week: 0.0 standard drinks     Current Outpatient Medications:  .  albuterol  (VENTOLIN HFA) 108 (90 Base) MCG/ACT inhaler, INHALE 2 PUFFS BY MOUTH EVERY 6 HOURS AS NEEDED FOR WHEEZING AND FOR SHORTNESS OF BREATH, Disp: 18 g, Rfl: 0 .  Alpha Lipoic Acid 200 MG CAPS, Take by mouth daily., Disp: , Rfl:  .  APPLE CIDER VINEGAR PO, Take 480 mg by mouth daily., Disp: , Rfl:  .  azelastine (ASTELIN) 0.1 % nasal spray, Place 1 spray into both nostrils as needed., Disp: 30 mL, Rfl: 1 .  budesonide-formoterol (SYMBICORT) 160-4.5 MCG/ACT inhaler, Inhale 2 puffs into the lungs 2 (two) times daily., Disp: 30.6 each, Rfl: 1 .  cetirizine (ZYRTEC) 10 MG tablet, Take 10 mg by mouth daily., Disp: , Rfl:  .  Cholecalciferol (VITAMIN D) 50 MCG (2000 UT) CAPS, Take 1 capsule by mouth daily., Disp: , Rfl:  .  Cyanocobalamin (VITAMIN B 12 PO), Take 1,000 mg by mouth daily., Disp: , Rfl:  .  fluticasone (FLONASE) 50 MCG/ACT nasal spray, Place 2 sprays into both nostrils as needed for allergies., Disp: 16 g, Rfl: 1 .  loratadine (CLARITIN) 10 MG tablet, Take 10 mg by mouth daily as needed for allergies., Disp: , Rfl:  .  montelukast (SINGULAIR) 10 MG tablet, Take 1 tablet (10 mg total) by mouth daily., Disp: 90 tablet, Rfl: 1 .  Multiple Vitamin (MULTI-VITAMIN) tablet, Take by mouth., Disp: , Rfl:  .  olmesartan (BENICAR) 40 MG tablet, Take 1 tablet (40 mg total) by mouth daily., Disp: 90 tablet, Rfl: 1 .  Semaglutide (RYBELSUS) 7 MG TABS, Take 1 tablet by mouth in the morning., Disp: 90 tablet, Rfl: 0 .  sildenafil (VIAGRA) 100 MG tablet, Take 0.5-1 tablets (50-100 mg total) by mouth daily as needed for erectile dysfunction., Disp: 30 tablet, Rfl: 1  Allergies  Allergen Reactions  . Aspirin   . Tetanus Toxoids Other (See Comments)    Flared up Asthma    I personally reviewed {Reviewed:14835} with the patient/caregiver today.   ROS  ***  Objective  There were no vitals filed for this visit.  There is no height or weight on file to calculate BMI.  Physical Exam ***  Recent  Results (from the past 2160 hour(s))  TSH     Status: None   Collection Time: 04/08/20  4:02 PM  Result Value Ref Range   TSH 1.57 0.40 - 4.50 mIU/L    Diabetic Foot Exam: Diabetic Foot Exam - Simple   No data filed    ***  PHQ2/9: Depression screen [facility] 2/9 04/08/2020 01/04/2020 03/23/2019 11/30/2018 06/05/2018  Decreased Interest 0 0 0 0 0  Down, Depressed, Hopeless 0 0 0 0 0  PHQ - 2 Score 0 0 0 0 0  Altered sleeping - - 0 0 0  Tired, decreased energy - -  0 0 0  Change in appetite - - 0 0 0  Feeling bad or failure about yourself  - - 0 0 0  Trouble concentrating - - 0 0 0  Moving slowly or fidgety/restless - - 0 0 0  Suicidal thoughts - - 0 0 0  PHQ-9 Score - - 0 0 0  Difficult doing work/chores - - Not difficult at all Not difficult at all Not difficult at all    phq 9 is {gen pos KDP:947076} ***  Fall Risk: Fall Risk  04/08/2020 01/04/2020 03/23/2019 11/30/2018 06/05/2018  Falls in the past year? 0 0 0 0 0  Number falls in past yr: 0 0 0 0 0  Injury with Fall? 0 0 0 0 0  Comment - - - - -   ***   Functional Status Survey:   ***   Assessment & Plan  *** There are no diagnoses linked to this encounter.

## 2020-07-08 ENCOUNTER — Ambulatory Visit: Payer: 59 | Admitting: Family Medicine

## 2020-07-10 ENCOUNTER — Ambulatory Visit: Payer: 59 | Admitting: Cardiology

## 2020-08-12 ENCOUNTER — Other Ambulatory Visit: Payer: Self-pay

## 2020-08-12 ENCOUNTER — Ambulatory Visit (INDEPENDENT_AMBULATORY_CARE_PROVIDER_SITE_OTHER): Payer: 59

## 2020-08-12 DIAGNOSIS — R0602 Shortness of breath: Secondary | ICD-10-CM

## 2020-08-12 LAB — ECHOCARDIOGRAM COMPLETE
AR max vel: 3.06 cm2
AV Area VTI: 3.18 cm2
AV Area mean vel: 2.62 cm2
AV Mean grad: 3 mmHg
AV Peak grad: 5.1 mmHg
Ao pk vel: 1.13 m/s
Area-P 1/2: 4.54 cm2
Calc EF: 51.9 %
S' Lateral: 3.4 cm
Single Plane A2C EF: 50.2 %
Single Plane A4C EF: 51.3 %

## 2020-08-22 ENCOUNTER — Other Ambulatory Visit: Payer: Self-pay

## 2020-08-22 ENCOUNTER — Ambulatory Visit (INDEPENDENT_AMBULATORY_CARE_PROVIDER_SITE_OTHER): Payer: 59 | Admitting: Cardiology

## 2020-08-22 ENCOUNTER — Encounter: Payer: Self-pay | Admitting: Cardiology

## 2020-08-22 VITALS — BP 126/76 | HR 100 | Ht 67.0 in | Wt 283.0 lb

## 2020-08-22 DIAGNOSIS — R0602 Shortness of breath: Secondary | ICD-10-CM

## 2020-08-22 DIAGNOSIS — I10 Essential (primary) hypertension: Secondary | ICD-10-CM | POA: Diagnosis not present

## 2020-08-22 DIAGNOSIS — E7800 Pure hypercholesterolemia, unspecified: Secondary | ICD-10-CM | POA: Diagnosis not present

## 2020-08-22 DIAGNOSIS — R000 Tachycardia, unspecified: Secondary | ICD-10-CM

## 2020-08-22 MED ORDER — VERAPAMIL HCL ER 120 MG PO CP24: 120.0000 mg | ORAL_CAPSULE | Freq: Every day | ORAL | 3 refills | Status: DC

## 2020-08-22 MED ORDER — OLMESARTAN MEDOXOMIL 40 MG PO TABS: 20.0000 mg | ORAL_TABLET | Freq: Every day | ORAL | 1 refills | Status: DC

## 2020-08-22 MED ORDER — VERAPAMIL HCL ER 120 MG PO TBCR: 120.0000 mg | EXTENDED_RELEASE_TABLET | Freq: Every day | ORAL | 3 refills | Status: DC

## 2020-08-22 NOTE — Progress Notes (Signed)
Cardiology Office Note:    Date:  08/22/2020   ID:  Matthew Barrett, DOB 02/06/1971, MRN [id number]  PCP:  Alba Cory, MD   [facility]  Cardiologist:  Debbe Odea, MD  Advanced Practice Provider:  No care team member to display Electrophysiologist:  None       Referring MD: Alba Cory, MD   Chief Complaint  Patient presents with   other    Follow up post ECHO. Meds reviewed verbally with patient.     History of Present Illness:    Matthew Barrett is a 50 y.o. male with a hx of asthma, hypertension, hyperlipidemia who presents for follow-up.  Previously seen for shortness of breath.  Symptoms were not deemed anginal equivalent.  Shortness of breath was a couple of months after diagnosed with COVID-19.  Overall symptoms of shortness of breath improving.  Echocardiogram was ordered to evaluate any structural abnormalities.  He now presents for echo results.  Patient still complains of elevated heart rates, between 100 to 115 bpm.  States using albuterol at least 3 times a week.   Past Medical History:  Diagnosis Date   Allergy    Asthma    Elevated blood pressure    Hyperlipidemia    Obesity    Tachycardia     Past Surgical History:  Procedure Laterality Date   TONSILECTOMY/ADENOIDECTOMY WITH MYRINGOTOMY      Current Medications: Current Meds  Medication Sig   albuterol (VENTOLIN HFA) 108 (90 Base) MCG/ACT inhaler INHALE 2 PUFFS BY MOUTH EVERY 6 HOURS AS NEEDED FOR WHEEZING AND FOR SHORTNESS OF BREATH   Alpha Lipoic Acid 200 MG CAPS Take by mouth daily.   APPLE CIDER VINEGAR PO Take 480 mg by mouth daily.   azelastine (ASTELIN) 0.1 % nasal spray Place 1 spray into both nostrils as needed.   budesonide-formoterol (SYMBICORT) 160-4.5 MCG/ACT inhaler Inhale 2 puffs into the lungs 2 (two) times daily.   cetirizine (ZYRTEC) 10 MG tablet Take 10 mg by mouth daily.   Cholecalciferol (VITAMIN D) 50 MCG (2000 UT) CAPS Take 1 capsule by  mouth daily.   Cyanocobalamin (VITAMIN B 12 PO) Take 1,000 mg by mouth daily.   fluticasone (FLONASE) 50 MCG/ACT nasal spray Place 2 sprays into both nostrils as needed for allergies.   loratadine (CLARITIN) 10 MG tablet Take 10 mg by mouth daily as needed for allergies.   montelukast (SINGULAIR) 10 MG tablet Take 1 tablet (10 mg total) by mouth daily.   Multiple Vitamin (MULTI-VITAMIN) tablet Take by mouth.   Semaglutide (RYBELSUS) 7 MG TABS Take 1 tablet by mouth in the morning.   sildenafil (VIAGRA) 100 MG tablet Take 0.5-1 tablets (50-100 mg total) by mouth daily as needed for erectile dysfunction.   verapamil (VERELAN PM) 120 MG 24 hr capsule Take 1 capsule (120 mg total) by mouth at bedtime.   [DISCONTINUED] olmesartan (BENICAR) 40 MG tablet Take 1 tablet (40 mg total) by mouth daily.   [DISCONTINUED] verapamil (CALAN-SR) 120 MG CR tablet Take 1 tablet (120 mg total) by mouth at bedtime.     Allergies:   Aspirin and Tetanus toxoids   Social History   Socioeconomic History   Marital status: Married    Spouse name: Rinaldo Cloud   Number of children: 0   Years of education: Not on file   Highest education level: 12th grade  Occupational History   Not on file  Tobacco Use   Smoking status: Never   Smokeless tobacco:  Former    Types: Chew    Quit date: 11/29/2017  Vaping Use   Vaping Use: Never used  Substance and Sexual Activity   Alcohol use: No    Alcohol/week: 0.0 standard drinks   Drug use: No   Sexual activity: Yes    Partners: Female  Other Topics Concern   Not on file  Social History Narrative   Married, no children   Social Determinants of Health   Financial Resource Strain: Not on file  Food Insecurity: Not on file  Transportation Needs: Not on file  Physical Activity: Not on file  Stress: Not on file  Social Connections: Not on file     Family History: The patient's family history includes Arthritis in his maternal grandmother, paternal grandfather, and  paternal grandmother; Asthma in his sister and sister; Benign prostatic hyperplasia in his father; CAD in his maternal grandfather; Congestive Heart Failure in his paternal grandfather; Dementia in his maternal grandfather; Diabetes in his maternal grandmother, paternal grandfather, and paternal grandmother; Heart block in his mother; Hyperlipidemia in his mother, sister, and sister; Hypertension in his maternal grandfather, mother, sister, and sister; Hypothyroidism in his mother; Kidney disease in his paternal grandfather; Prostatitis in his father; Psoriasis in his maternal grandfather.  ROS:   Please see the history of present illness.     All other systems reviewed and are negative.  EKGs/Labs/Other Studies Reviewed:    The following studies were reviewed today:   EKG:  EKG not ordered today.    Recent Labs: 01/09/2020: ALT 41; BUN 17; Creat 1.19; Hemoglobin 15.4; Platelets 355; Potassium 4.4; Sodium 137 04/08/2020: TSH 1.57  Recent Lipid Panel    Component Value Date/Time   CHOL 199 01/09/2020 0955   TRIG 170 (H) 01/09/2020 0955   HDL 44 01/09/2020 0955   CHOLHDL 4.5 01/09/2020 0955   VLDL 18 09/21/2016 1208   LDLCALC 126 (H) 01/09/2020 0955     Risk Assessment/Calculations:      Physical Exam:    VS:  BP 126/76 (BP Location: Left Arm, Patient Position: Sitting, Cuff Size: Large)   Pulse 100   Ht 5\' 7"  (1.702 m)   Wt 283 lb (128.4 kg)   SpO2 96%   BMI 44.32 kg/m     Wt Readings from Last 3 Encounters:  08/22/20 283 lb (128.4 kg)  04/21/20 283 lb 4 oz (128.5 kg)  04/08/20 279 lb (126.6 kg)     GEN:  Well nourished, well developed in no acute distress HEENT: Normal NECK: No JVD; No carotid bruits LYMPHATICS: No lymphadenopathy CARDIAC: RRR, no murmurs, rubs, gallops RESPIRATORY:  Clear to auscultation without rales, wheezing or rhonchi  ABDOMEN: Soft, non-tender, non-distended MUSCULOSKELETAL:  No edema; No deformity  SKIN: Warm and dry NEUROLOGIC:  Alert  and oriented x 3 PSYCHIATRIC:  Normal affect   ASSESSMENT:    1. SOB (shortness of breath)   2. Primary hypertension   3. Pure hypercholesterolemia   4. Tachycardia   5. Essential hypertension     PLAN:    In order of problems listed above:  Patient with shortness of breath, likely from lingering COVID-19 effects, deconditioning.  Echocardiogram with preserved ejection fraction, EF 50 to 55%.  Impaired relaxation.  Symptoms improving, patient reassured. Hypertension, BP controlled.  Decrease Benicar to 20 mg daily, start verapamil 120 mg daily. Hyperlipidemia, not in statin benefit group.  Continue low-cholesterol diet. Tachycardia, likely from caffeine intake, albuterol use.  Baseline heart rates between 100-115.  Start verapamil  to help with both blood pressure and elevated heart rates.  Avoiding beta-blockers due to asthma requiring frequent albuterol use.   Follow-up in 8 weeks    Medication Adjustments/Labs and Tests Ordered: Current medicines are reviewed at length with the patient today.  Concerns regarding medicines are outlined above.  No orders of the defined types were placed in this encounter.  Meds ordered this encounter  Medications   DISCONTD: verapamil (CALAN-SR) 120 MG CR tablet    Sig: Take 1 tablet (120 mg total) by mouth at bedtime.    Dispense:  30 tablet    Refill:  3   DISCONTD: olmesartan (BENICAR) 40 MG tablet    Sig: Take 0.5 tablets (20 mg total) by mouth daily.    Dispense:  90 tablet    Refill:  1   olmesartan (BENICAR) 40 MG tablet    Sig: Take 0.5 tablets (20 mg total) by mouth daily.    Dispense:  90 tablet    Refill:  1   DISCONTD: verapamil (CALAN-SR) 120 MG CR tablet    Sig: Take 1 tablet (120 mg total) by mouth at bedtime.    Dispense:  30 tablet    Refill:  3   verapamil (VERELAN PM) 120 MG 24 hr capsule    Sig: Take 1 capsule (120 mg total) by mouth at bedtime.    Dispense:  30 capsule    Refill:  3     Patient Instructions   Medication Instructions:   Your physician has recommended you make the following change in your medication:    START taking Verapamil (Calan-SR) 120 MG once a day at bedtime.  2.    DECREASE your Benicar to 20 MG daily.  *If you need a refill on your cardiac medications before your next appointment, please call your pharmacy*   Lab Work: None ordered If you have labs (blood work) drawn today and your tests are completely normal, you will receive your results only by: MyChart Message (if you have MyChart) OR A paper copy in the mail If you have any lab test that is abnormal or we need to change your treatment, we will call you to review the results.   Testing/Procedures: None ordered   Follow-Up: At [facility], you and your health needs are our priority.  As part of our continuing mission to provide you with exceptional heart care, we have created designated Provider Care Teams.  These Care Teams include your primary Cardiologist (physician) and Advanced Practice Providers (APPs -  Physician Assistants and Nurse Practitioners) who all work together to provide you with the care you need, when you need it.  We recommend signing up for the patient portal called "MyChart".  Sign up information is provided on this After Visit Summary.  MyChart is used to connect with patients for Virtual Visits (Telemedicine).  Patients are able to view lab/test results, encounter notes, upcoming appointments, etc.  Non-urgent messages can be sent to your provider as well.   To learn more about what you can do with MyChart, go to [email or web address].    Your next appointment:   8 week(s)  The format for your next appointment:   In Person  Provider:   Debbe Odea, MD   Other Instructions    Signed, Debbe Odea, MD  08/22/2020 4:53 PM    [facility]

## 2020-08-22 NOTE — Patient Instructions (Signed)
Medication Instructions:   Your physician has recommended you make the following change in your medication:    START taking Verapamil (Calan-SR) 120 MG once a day at bedtime.  2.    DECREASE your Benicar to 20 MG daily.  *If you need a refill on your cardiac medications before your next appointment, please call your pharmacy*   Lab Work: None ordered If you have labs (blood work) drawn today and your tests are completely normal, you will receive your results only by: MyChart Message (if you have MyChart) OR A paper copy in the mail If you have any lab test that is abnormal or we need to change your treatment, we will call you to review the results.   Testing/Procedures: None ordered   Follow-Up: At [facility], you and your health needs are our priority.  As part of our continuing mission to provide you with exceptional heart care, we have created designated Provider Care Teams.  These Care Teams include your primary Cardiologist (physician) and Advanced Practice Providers (APPs -  Physician Assistants and Nurse Practitioners) who all work together to provide you with the care you need, when you need it.  We recommend signing up for the patient portal called "MyChart".  Sign up information is provided on this After Visit Summary.  MyChart is used to connect with patients for Virtual Visits (Telemedicine).  Patients are able to view lab/test results, encounter notes, upcoming appointments, etc.  Non-urgent messages can be sent to your provider as well.   To learn more about what you can do with MyChart, go to [email or web address].    Your next appointment:   8 week(s)  The format for your next appointment:   In Person  Provider:   Debbe Odea, MD   Other Instructions

## 2020-08-23 ENCOUNTER — Other Ambulatory Visit: Payer: Self-pay | Admitting: Family Medicine

## 2020-08-23 DIAGNOSIS — J4540 Moderate persistent asthma, uncomplicated: Secondary | ICD-10-CM

## 2020-10-17 ENCOUNTER — Ambulatory Visit: Payer: 59 | Admitting: Cardiology

## 2020-10-20 ENCOUNTER — Other Ambulatory Visit: Payer: Self-pay | Admitting: Family Medicine

## 2020-10-20 DIAGNOSIS — I10 Essential (primary) hypertension: Secondary | ICD-10-CM

## 2020-10-21 NOTE — Telephone Encounter (Signed)
Lvm informing that prescription has been sent to pharmacy also to return call to sch follow up appt

## 2020-11-14 ENCOUNTER — Other Ambulatory Visit: Payer: Self-pay | Admitting: Family Medicine

## 2020-11-14 DIAGNOSIS — I10 Essential (primary) hypertension: Secondary | ICD-10-CM

## 2020-11-14 NOTE — Telephone Encounter (Signed)
Lvm to sch appt °

## 2020-11-17 NOTE — Telephone Encounter (Signed)
Lvm to sch appt °

## 2020-11-20 ENCOUNTER — Other Ambulatory Visit: Payer: Self-pay | Admitting: Family Medicine

## 2020-11-20 DIAGNOSIS — I10 Essential (primary) hypertension: Secondary | ICD-10-CM

## 2020-11-20 MED ORDER — OLMESARTAN MEDOXOMIL 40 MG PO TABS: 40.0000 mg | ORAL_TABLET | Freq: Every day | ORAL | 0 refills | Status: DC

## 2020-11-20 NOTE — Telephone Encounter (Signed)
Medication Refill - Medication:  olmesartan (BENICAR) 40 MG tablet   Has the patient contacted their pharmacy? Yes, contact PCP  Preferred Pharmacy (with phone number or street name):  [facility] [phone] [place], [place] - [address]  [address] [place], [place] [address]  Phone:  [phone]  Fax:  [phone]  Has the patient been seen for an appointment in the last year OR does the patient have an upcoming appointment? Yes.  Pt has appt scheduled 11/7, but needs a refill to hold him until.  Agent: Please be advised that RX refills may take up to 3 business days. We ask that you follow-up with your pharmacy.

## 2020-11-26 ENCOUNTER — Ambulatory Visit: Payer: 59 | Admitting: Family Medicine

## 2020-12-19 NOTE — Progress Notes (Signed)
Name: Matthew Barrett   MRN: [id number]    DOB: [birth year]   Date:12/22/2020       Progress Note  Subjective  Chief Complaint  Medication Refill  HPI  Asthma: Currently using Symbicort BID and states asthma has been controlled , no wheezing, cough or SOB   HTN:  bp at home has been well controlled  and also here. He denies palpitation, sob or chest pain.    Hyperlipidemia:   The 10-year ASCVD risk score (Arnett DK, et al., 2019) is: 3.5%   Values used to calculate the score:     Age: 50 years     Sex: Male     Is Non-Hispanic African American: No     Diabetic: No     Tobacco smoker: No     Systolic Blood Pressure: 122 mmHg     Is BP treated: Yes     HDL Cholesterol: 44 mg/dL     Total Cholesterol: 199 mg/dL    Morbid Obesity: he took Ozempic but not covered by insurance. He is now on Rybelsus 7 mg and is not curbing his appetite. He gained more weight since last  visit up, 12 lbs in the past 6 months . He works 6 days a week and is unable to exercise, he has been cutting down on soda intake. He has increase leafy vegetables. He still eats junk food about once a week, but dinners are [place] type food with fried chicken, mashed potatoes. Discussed portion control and cutting down on carbohydrates    Metabolic syndrome: history of hypertension, elevated triglycerides, increase abomnal girth. He is taking Rybelsus 7 mg and we will adjust to 14 mg per day   Perennial Allergic Rhinitis  he states symptoms are well controlled at this time, occasional sneezing, nasal congestion or rhinorrhea, but usually worse during the Summer when cutting the grass . He alternates otc medications   Low Testosterone and ED : He started viagra fall 2020 not on testosterone supplementation , he is frustrated because Viagra does not work and no libido, willing to see Urologist   COVID-19 history : diagnosed on 03/04/2020 at [facility] treated with doxy, prednisone and codeine and guanfenicin,  he had to go back to have a negative test prior to going back to work, repeat test was  negative, he was diagnosed with bronchitis and was given antibiotics  Steroids. His heart rate has been elevated since, he went to see cardiologist but did not start Cardizem, he is willing to try low dose Atenolol at night   Patient Active Problem List   Diagnosis Date Noted   Tachycardia 08/08/2017   Hypertriglyceridemia 05/28/2016   Hypertension 11/24/2015   Perennial allergic rhinitis with seasonal variation 12/10/2014   Asthma, moderate persistent 12/10/2014   Dyslipidemia 12/10/2014   Obesity, Class III, BMI 40-49.9 (morbid obesity) (HCC) 12/10/2014    Past Surgical History:  Procedure Laterality Date   TONSILECTOMY/ADENOIDECTOMY WITH MYRINGOTOMY      Family History  Problem Relation Age of Onset   Hyperlipidemia Mother    Hypertension Mother    Hypothyroidism Mother    Heart block Mother    Benign prostatic hyperplasia Father    Prostatitis Father        Had prostate removed due to being enlarged   Hyperlipidemia Sister    Hypertension Sister    Asthma Sister    CAD Maternal Grandfather    Hypertension Maternal Grandfather    Psoriasis Maternal Grandfather  Dementia Maternal Grandfather    Congestive Heart Failure Paternal Grandfather    Diabetes Paternal Grandfather    Arthritis Paternal Grandfather    Kidney disease Paternal Grandfather    Diabetes Maternal Grandmother    Arthritis Maternal Grandmother    Diabetes Paternal Grandmother    Arthritis Paternal Grandmother    Hypertension Sister    Hyperlipidemia Sister    Asthma Sister     Social History   Tobacco Use   Smoking status: Never   Smokeless tobacco: Former    Types: Chew    Quit date: 11/29/2017  Substance Use Topics   Alcohol use: No    Alcohol/week: 0.0 standard drinks     Current Outpatient Medications:    albuterol (VENTOLIN HFA) 108 (90 Base) MCG/ACT inhaler, INHALE 2 PUFFS BY MOUTH EVERY 6  HOURS AS NEEDED FOR WHEEZING AND SHORTNESS OF BREATH, Disp: 8.5 each, Rfl: 0   Alpha Lipoic Acid 200 MG CAPS, Take by mouth daily., Disp: , Rfl:    APPLE CIDER VINEGAR PO, Take 480 mg by mouth daily., Disp: , Rfl:    azelastine (ASTELIN) 0.1 % nasal spray, Place 1 spray into both nostrils as needed., Disp: 30 mL, Rfl: 1   budesonide-formoterol (SYMBICORT) 160-4.5 MCG/ACT inhaler, Inhale 2 puffs into the lungs 2 (two) times daily., Disp: 30.6 each, Rfl: 1   cetirizine (ZYRTEC) 10 MG tablet, Take 10 mg by mouth daily., Disp: , Rfl:    Cholecalciferol (VITAMIN D) 50 MCG (2000 UT) CAPS, Take 1 capsule by mouth daily., Disp: , Rfl:    fluticasone (FLONASE) 50 MCG/ACT nasal spray, Place 2 sprays into both nostrils as needed for allergies., Disp: 16 g, Rfl: 1   loratadine (CLARITIN) 10 MG tablet, Take 10 mg by mouth daily as needed for allergies., Disp: , Rfl:    montelukast (SINGULAIR) 10 MG tablet, Take 1 tablet (10 mg total) by mouth daily., Disp: 90 tablet, Rfl: 1   Multiple Vitamin (MULTI-VITAMIN) tablet, Take by mouth., Disp: , Rfl:    olmesartan (BENICAR) 40 MG tablet, Take 1 tablet (40 mg total) by mouth daily., Disp: 30 tablet, Rfl: 0   Semaglutide (RYBELSUS) 7 MG TABS, Take 1 tablet by mouth in the morning., Disp: 90 tablet, Rfl: 0   sildenafil (VIAGRA) 100 MG tablet, Take 0.5-1 tablets (50-100 mg total) by mouth daily as needed for erectile dysfunction., Disp: 30 tablet, Rfl: 1   verapamil (VERELAN PM) 120 MG 24 hr capsule, Take 1 capsule (120 mg total) by mouth at bedtime., Disp: 30 capsule, Rfl: 3   Cyanocobalamin (VITAMIN B 12 PO), Take 1,000 mg by mouth daily. (Patient not taking: Reported on 12/22/2020), Disp: , Rfl:   Allergies  Allergen Reactions   Aspirin    Tetanus Toxoids Other (See Comments)    Flared up Asthma    I personally reviewed active problem list, medication list, allergies, family history, social history, health maintenance with the patient/caregiver  today.   ROS  Constitutional: Negative for fever , positive weight change.  Respiratory: Negative for cough and shortness of breath.   Cardiovascular: Negative for chest pain or palpitations.  Gastrointestinal: Negative for abdominal pain, no bowel changes.  Musculoskeletal: Negative for gait problem or joint swelling.  Skin: Negative for rash.  Neurological: Negative for dizziness or headache.  No other specific complaints in a complete review of systems (except as listed in HPI above).   Objective  Vitals:   12/22/20 1018  BP: 122/84  Pulse: (!) 102  Resp: 16  Temp: 98.1 F (36.7 C)  SpO2: 96%  Weight: 295 lb (133.8 kg)  Height: 5\' 7"  (1.702 m)    Body mass index is 46.2 kg/m.  Physical Exam  Constitutional: Patient appears well-developed and well-nourished. Obese  No distress.  HEENT: head atraumatic, normocephalic, pupils equal and reactive to light, neck supple Cardiovascular: Normal rate, regular rhythm and normal heart sounds.  No murmur heard. No BLE edema. Pulmonary/Chest: Effort normal and breath sounds normal. No respiratory distress. Abdominal: Soft.  There is no tenderness. Psychiatric: Patient has a normal mood and affect. behavior is normal. Judgment and thought content normal.    PHQ2/9: Depression screen [facility] 2/9 12/22/2020 04/08/2020 01/04/2020 03/23/2019 11/30/2018  Decreased Interest 0 0 0 0 0  Down, Depressed, Hopeless 0 0 0 0 0  PHQ - 2 Score 0 0 0 0 0  Altered sleeping 0 - - 0 0  Tired, decreased energy 0 - - 0 0  Change in appetite 0 - - 0 0  Feeling bad or failure about yourself  0 - - 0 0  Trouble concentrating 0 - - 0 0  Moving slowly or fidgety/restless 0 - - 0 0  Suicidal thoughts 0 - - 0 0  PHQ-9 Score 0 - - 0 0  Difficult doing work/chores - - - Not difficult at all Not difficult at all    phq 9 is negative   Fall Risk: Fall Risk  12/22/2020 04/08/2020 01/04/2020 03/23/2019 11/30/2018  Falls in the past year? 0 0 0 0 0  Number falls  in past yr: 0 0 0 0 0  Injury with Fall? 0 0 0 0 0  Comment - - - - -  Risk for fall due to : No Fall Risks - - - -  Follow up Falls prevention discussed - - - -      Functional Status Survey: Is the patient deaf or have difficulty hearing?: Yes Does the patient have difficulty seeing, even when wearing glasses/contacts?: No Does the patient have difficulty concentrating, remembering, or making decisions?: No Does the patient have difficulty walking or climbing stairs?: No Does the patient have difficulty dressing or bathing?: No Does the patient have difficulty doing errands alone such as visiting a doctor's office or shopping?: No    Assessment & Plan  1. Moderate persistent asthma without complication  - montelukast (SINGULAIR) 10 MG tablet; Take 1 tablet (10 mg total) by mouth daily.  Dispense: 90 tablet; Refill: 1 - budesonide-formoterol (SYMBICORT) 160-4.5 MCG/ACT inhaler; Inhale 2 puffs into the lungs 2 (two) times daily.  Dispense: 30.6 each; Refill: 1  2. Obesity, Class III, BMI 40-49.9 (morbid obesity) (HCC)  Discussed with the patient the risk posed by an increased BMI. Discussed importance of portion control, calorie counting and at least 150 minutes of physical activity weekly. Avoid sweet beverages and drink more water. Eat at least 6 servings of fruit and vegetables daily    3. Need for immunization against influenza  - Flu Vaccine QUAD 83mo+IM (Fluarix, Fluzone & Alfiuria Quad PF)  4. Metabolic syndrome  - Hemoglobin A1c  5. Dyslipidemia  - Lipid panel  6. Essential hypertension  - COMPLETE METABOLIC PANEL WITH GFR - olmesartan (BENICAR) 40 MG tablet; Take 1 tablet (40 mg total) by mouth daily.  Dispense: 90 tablet; Refill: 0  7. Perennial allergic rhinitis with seasonal variation  - montelukast (SINGULAIR) 10 MG tablet; Take 1 tablet (10 mg total) by mouth daily.  Dispense: 90  tablet; Refill: 1  8. Low testosterone in male  - Testosterone,Free and  Total - Ambulatory referral to Urology  9. Tachycardia  - atenolol (TENORMIN) 25 MG tablet; Take 1 tablet (25 mg total) by mouth daily.  Dispense: 90 tablet; Refill: 0 - CBC with Differential/Platelet - Thyroid Panel With TSH  10. Other male erectile dysfunction  - Ambulatory referral to Urology

## 2020-12-22 ENCOUNTER — Ambulatory Visit: Payer: 59 | Admitting: Family Medicine

## 2020-12-22 ENCOUNTER — Other Ambulatory Visit: Payer: Self-pay

## 2020-12-22 ENCOUNTER — Encounter: Payer: Self-pay | Admitting: Family Medicine

## 2020-12-22 ENCOUNTER — Other Ambulatory Visit: Payer: Self-pay | Admitting: Family Medicine

## 2020-12-22 VITALS — BP 122/84 | HR 102 | Temp 98.1°F | Resp 16 | Ht 67.0 in | Wt 295.0 lb

## 2020-12-22 DIAGNOSIS — I10 Essential (primary) hypertension: Secondary | ICD-10-CM

## 2020-12-22 DIAGNOSIS — E785 Hyperlipidemia, unspecified: Secondary | ICD-10-CM

## 2020-12-22 DIAGNOSIS — R7989 Other specified abnormal findings of blood chemistry: Secondary | ICD-10-CM

## 2020-12-22 DIAGNOSIS — N528 Other male erectile dysfunction: Secondary | ICD-10-CM

## 2020-12-22 DIAGNOSIS — J4540 Moderate persistent asthma, uncomplicated: Secondary | ICD-10-CM

## 2020-12-22 DIAGNOSIS — J3089 Other allergic rhinitis: Secondary | ICD-10-CM

## 2020-12-22 DIAGNOSIS — Z23 Encounter for immunization: Secondary | ICD-10-CM | POA: Diagnosis not present

## 2020-12-22 DIAGNOSIS — E8881 Metabolic syndrome: Secondary | ICD-10-CM | POA: Diagnosis not present

## 2020-12-22 DIAGNOSIS — R000 Tachycardia, unspecified: Secondary | ICD-10-CM

## 2020-12-22 DIAGNOSIS — J302 Other seasonal allergic rhinitis: Secondary | ICD-10-CM

## 2020-12-22 MED ORDER — MONTELUKAST SODIUM 10 MG PO TABS: 10.0000 mg | ORAL_TABLET | Freq: Every day | ORAL | 1 refills | Status: DC

## 2020-12-22 MED ORDER — RYBELSUS 14 MG PO TABS: 14.0000 mg | ORAL_TABLET | Freq: Every day | ORAL | 1 refills | Status: DC

## 2020-12-22 MED ORDER — ATENOLOL 25 MG PO TABS: 25.0000 mg | ORAL_TABLET | Freq: Every day | ORAL | 0 refills | Status: DC

## 2020-12-22 MED ORDER — OLMESARTAN MEDOXOMIL 20 MG PO TABS: 40.0000 mg | ORAL_TABLET | Freq: Every day | ORAL | 0 refills | Status: DC

## 2020-12-22 MED ORDER — OLMESARTAN MEDOXOMIL 40 MG PO TABS: 40.0000 mg | ORAL_TABLET | Freq: Every day | ORAL | 0 refills | Status: DC

## 2020-12-22 MED ORDER — BUDESONIDE-FORMOTEROL FUMARATE 160-4.5 MCG/ACT IN AERO: 2.0000 | INHALATION_SPRAY | Freq: Two times a day (BID) | RESPIRATORY_TRACT | 1 refills | Status: DC

## 2020-12-22 NOTE — Telephone Encounter (Signed)
PA initiated, awaiting determination.

## 2020-12-23 LAB — LIPID PANEL
Cholesterol: 213 mg/dL — ABNORMAL HIGH (ref <200)
HDL: 51 mg/dL (ref >=40)
LDL Cholesterol (Calc): 125 mg/dL (calc) — ABNORMAL HIGH
Non-HDL Cholesterol (Calc): 162 mg/dL (calc) — ABNORMAL HIGH (ref <130)
Total CHOL/HDL Ratio: 4.2 (calc) (ref <5.0)
Triglycerides: 228 mg/dL — ABNORMAL HIGH (ref <150)

## 2020-12-23 LAB — CBC WITH DIFFERENTIAL/PLATELET
Absolute Monocytes: 533 cells/uL (ref 200–950)
Basophils Absolute: 30 cells/uL (ref 0–200)
Basophils Relative: 0.4 %
Eosinophils Absolute: 38 cells/uL (ref 15–500)
Eosinophils Relative: 0.5 %
HCT: 44.8 % (ref 38.5–50.0)
Hemoglobin: 15.1 g/dL (ref 13.2–17.1)
Lymphs Abs: 1748 cells/uL (ref 850–3900)
MCH: 30.9 pg (ref 27.0–33.0)
MCHC: 33.7 g/dL (ref 32.0–36.0)
MCV: 91.8 fL (ref 80.0–100.0)
MPV: 10.1 fL (ref 7.5–12.5)
Monocytes Relative: 7.1 %
Neutro Abs: 5153 cells/uL (ref 1500–7800)
Neutrophils Relative %: 68.7 %
Platelets: 296 10*3/uL (ref 140–400)
RBC: 4.88 10*6/uL (ref 4.20–5.80)
RDW: 13.2 % (ref 11.0–15.0)
Total Lymphocyte: 23.3 %
WBC: 7.5 10*3/uL (ref 3.8–10.8)

## 2020-12-23 LAB — HEMOGLOBIN A1C
Hgb A1c MFr Bld: 5.7 % of total Hgb — ABNORMAL HIGH (ref <5.7)
Mean Plasma Glucose: 117 mg/dL
eAG (mmol/L): 6.5 mmol/L

## 2020-12-23 LAB — COMPLETE METABOLIC PANEL WITH GFR
AG Ratio: 1.6 (calc) (ref 1.0–2.5)
ALT: 40 U/L (ref 9–46)
AST: 24 U/L (ref 10–40)
Albumin: 4.3 g/dL (ref 3.6–5.1)
Alkaline phosphatase (APISO): 67 U/L (ref 36–130)
BUN/Creatinine Ratio: 15 (calc) (ref 6–22)
BUN: 20 mg/dL (ref 7–25)
CO2: 26 mmol/L (ref 20–32)
Calcium: 9.3 mg/dL (ref 8.6–10.3)
Chloride: 104 mmol/L (ref 98–110)
Creat: 1.34 mg/dL — ABNORMAL HIGH (ref 0.60–1.29)
Globulin: 2.7 g/dL (calc) (ref 1.9–3.7)
Glucose, Bld: 86 mg/dL (ref 65–99)
Potassium: 4.7 mmol/L (ref 3.5–5.3)
Sodium: 139 mmol/L (ref 135–146)
Total Bilirubin: 0.4 mg/dL (ref 0.2–1.2)
Total Protein: 7 g/dL (ref 6.1–8.1)
eGFR: 65 mL/min/{1.73_m2} (ref >=60)

## 2020-12-23 LAB — THYROID PANEL WITH TSH
Free Thyroxine Index: 1.4 (ref 1.4–3.8)
T3 Uptake: 30 % (ref 22–35)
T4, Total: 4.8 ug/dL — ABNORMAL LOW (ref 4.9–10.5)
TSH: 1.44 mIU/L (ref 0.40–4.50)

## 2020-12-23 LAB — TESTOSTERONE: Testosterone: 156 ng/dL — ABNORMAL LOW (ref 250–827)

## 2021-01-14 ENCOUNTER — Other Ambulatory Visit: Payer: Self-pay

## 2021-01-14 ENCOUNTER — Encounter: Payer: Self-pay | Admitting: Urology

## 2021-01-14 ENCOUNTER — Ambulatory Visit (INDEPENDENT_AMBULATORY_CARE_PROVIDER_SITE_OTHER): Payer: 59 | Admitting: Urology

## 2021-01-14 VITALS — BP 123/79 | HR 98 | Ht 67.0 in | Wt 285.0 lb

## 2021-01-14 DIAGNOSIS — N529 Male erectile dysfunction, unspecified: Secondary | ICD-10-CM

## 2021-01-14 DIAGNOSIS — E291 Testicular hypofunction: Secondary | ICD-10-CM

## 2021-01-14 MED ORDER — CLOMIPHENE CITRATE 50 MG PO TABS: 25.0000 mg | ORAL_TABLET | Freq: Every day | ORAL | 6 refills | Status: DC

## 2021-01-14 MED ORDER — TADALAFIL 10 MG PO TABS: 10.0000 mg | ORAL_TABLET | ORAL | 11 refills | Status: DC

## 2021-01-14 NOTE — Patient Instructions (Signed)
Hypogonadism, Male ?Male hypogonadism is a condition of having a level of testosterone that is lower than normal. Testosterone is a chemical, or hormone, that is made mainly in the testicles. ?In boys, testosterone is responsible for the development of male characteristics during puberty. These include: ?Making the penis bigger. ?Growing and building the muscles. ?Growing facial hair. ?Deepening the voice. ?In adult men, testosterone is responsible for maintaining: ?An interest in sex and the ability to have sex. ?Muscle mass. ?Sperm production. ?Red blood cell production. ?Bone strength. ?Testosterone also gives men energy and a sense of well-being. ?Testosterone normally decreases as men age and the testicles make less testosterone. Testosterone levels can vary from man to man. Not all men will have signs and symptoms of low testosterone. Weight, alcohol use, medicines, and certain medical conditions can affect a man's testosterone level. ?What are the causes? ?This condition is caused by: ?A natural decrease in testosterone that occurs as a man grows older. This is the main cause of this condition. ?Use of medicines, such as antidepressants, steroids, and opioids. ?Diseases and conditions that affect the testicles or the making of testosterone. These include: ?Injury or damage to the testicles from trauma, cancer, cancer treatment, or infection. ?Diabetes. ?Sleep apnea. ?Genetic conditions that men are born with. ?Disease of the pituitary gland. This gland is in the brain. It produces hormones. ?Obesity. ?Metabolic syndrome. This is a group of diseases that affect blood pressure, blood sugar, cholesterol, and belly fat. ?HIV or AIDS. ?Alcohol abuse. ?Kidney failure. ?Other long-term or chronic diseases. ?What are the signs or symptoms? ?Common symptoms of this condition include: ?Loss of interest in sex (low sex drive). ?Inability to have or maintain an erection (erectile dysfunction). ?Feeling tired  (fatigue). ?Mood changes, like irritability or depression. ?Loss of muscle and body hair. ?Infertility. ?Large breasts. ?Weight gain (obesity). ?How is this diagnosed? ?Your health care provider can diagnose hypogonadism based on: ?Your signs and symptoms. ?A physical exam to check your testosterone levels. This includes blood tests. Testosterone levels can change throughout the day. Levels are highest in the morning. You may need to have repeat blood tests before getting a diagnosis of hypogonadism. ?Depending on your medical history and test results, your health care provider may also do other tests to find the cause of low testosterone. ?How is this treated? ?This condition is treated with testosterone replacement therapy. Testosterone can be given by: ?Injection or through pellets inserted under the skin. ?Gels or patches placed on the skin or in the mouth. ?Testosterone therapy is not for everyone. It has risks and side effects. Your health care provider will consider your medical history, your risk for prostate cancer, your age, and your symptoms before putting you on testosterone replacement therapy. ?Follow these instructions at home: ?Take over-the-counter and prescription medicines only as told by your health care provider. ?Eat foods that are high in fiber, such as beans, whole grains, and fresh fruits and vegetables. Limit foods that are high in fat and processed sugars, such as fried or sweet foods. ?If you drink alcohol: ?Limit how much you have to 0-2 drinks a day. ?Know how much alcohol is in your drink. In the [place] one drink equals one 12 oz bottle of beer (355 mL), one 5 oz glass of wine (148 mL), or one 1? oz glass of hard liquor (44 mL). ?Return to your normal activities as told by your health care provider. Ask your health care provider what activities are safe for you. ?Keep all follow-up   visits. This is important. ?Contact a health care provider if: ?You have any of the signs or symptoms of  low testosterone. ?You have any side effects from testosterone therapy. ?Summary ?Male hypogonadism is a condition of having a level of testosterone that is lower than normal. ?The natural drop in testosterone production that occurs with age is the most common cause of this condition. ?Low testosterone can also be caused by many diseases and conditions that affect the testicles and the making of testosterone. ?This condition is treated with testosterone replacement therapy. ?There are risks and side effects of testosterone therapy. Your health care provider will consider your age, medical history, symptoms, and risks for prostate cancer before putting you on testosterone therapy. ?This information is not intended to replace advice given to you by your health care provider. Make sure you discuss any questions you have with your health care provider. ?Document Revised: 10/04/2019 Document Reviewed: 10/04/2019 ?Elsevier Patient Education ? 2022 [employer] ? ?

## 2021-01-14 NOTE — Progress Notes (Signed)
   01/14/21 3:05 PM   Matthew Barrett 1970/06/22 [id number]  CC: Low testosterone, erectile dysfunction  HPI: I saw Matthew Barrett and his wife today for the above issues.  He is a 50 year old male with morbid obesity and a BMI of 45, multiple low testosterone values over the last few years ranging from 156-191, and trouble with erections.  He has previously tried 100 mg on-demand Viagra with no improvement.  No recent PSA values to review.  No family history of prostate cancer.   PMH: Past Medical History:  Diagnosis Date   Allergy    Asthma    Elevated blood pressure    Hyperlipidemia    Obesity    Tachycardia     Surgical History: Past Surgical History:  Procedure Laterality Date   TONSILECTOMY/ADENOIDECTOMY WITH MYRINGOTOMY       Family History: Family History  Problem Relation Age of Onset   Hyperlipidemia Mother    Hypertension Mother    Hypothyroidism Mother    Heart block Mother    Benign prostatic hyperplasia Father    Prostatitis Father        Had prostate removed due to being enlarged   Hyperlipidemia Sister    Hypertension Sister    Asthma Sister    CAD Maternal Grandfather    Hypertension Maternal Grandfather    Psoriasis Maternal Grandfather    Dementia Maternal Grandfather    Congestive Heart Failure Paternal Grandfather    Diabetes Paternal Grandfather    Arthritis Paternal Grandfather    Kidney disease Paternal Grandfather    Diabetes Maternal Grandmother    Arthritis Maternal Grandmother    Diabetes Paternal Grandmother    Arthritis Paternal Grandmother    Hypertension Sister    Hyperlipidemia Sister    Asthma Sister     Social History:  reports that he has never smoked. He quit smokeless tobacco use about 3 years ago.  His smokeless tobacco use included chew. He reports that he does not drink alcohol and does not use drugs.  Physical Exam: BP 123/79   Pulse 98   Ht 5\' 7"  (1.702 m)   Wt 285 lb (129.3 kg)   BMI 44.64 kg/m     Constitutional:  Alert and oriented, No acute distress. Cardiovascular: No clubbing, cyanosis, or edema. Respiratory: Normal respiratory effort, no increased work of breathing. GI: Abdomen is soft, nontender, nondistended, no abdominal masses   Laboratory Data: Reviewed  Assessment & Plan:   50 year old male with erectile dysfunction and low testosterone, with most recent value of 156.  He failed a trial of Viagra previously.  We reviewed the AUA guidelines regarding erectile dysfunction and low testosterone, and after a long conversation I ultimately recommended Cialis 10 mg every other day as well as Clomid 25 mg daily for his low testosterone.  He would like to avoid injections if at all possible for testosterone.  We also reviewed behavioral strategies including weight loss, adequate sleep, exercise, and healthy eating habits.  Trial of Cialis 10 mg every other day for ED and Clomid 25 mg daily for low testosterone RTC 6 weeks with AM testosterone, PSA, H/H   55, MD 01/14/2021  [facility] [address], [address] [place], [place] [phone]

## 2021-02-23 ENCOUNTER — Other Ambulatory Visit: Payer: 59

## 2021-02-25 ENCOUNTER — Ambulatory Visit: Payer: 59 | Admitting: Urology

## 2021-03-23 NOTE — Progress Notes (Signed)
Name: Matthew Barrett   MRN: [id number]    DOB: 1970-06-13   Date:03/24/2021       Progress Note  Subjective  Chief Complaint  Follow Up  HPI  Asthma: Currently using Symbicort BID and states asthma has been controlled , no wheezing, cough or SOB. Continue current regiment    HTN:  BP is at goal, no chest pain, palpitation or SOB. Heart rate controlled with Atenolol   Hyperlipidemia:   The 10-year ASCVD risk score (Arnett DK, et al., 2019) is: 4.1%   Values used to calculate the score:     Age: 51 years     Sex: Male     Is Non-Hispanic African American: No     Diabetic: No     Tobacco smoker: No     Systolic Blood Pressure: 132 mmHg     Is BP treated: Yes     HDL Cholesterol: 51 mg/dL     Total Cholesterol: 213 mg/dL    Morbid Obesity: he took Ozempic but not covered by insurance. He is now on Rybelsus 7 mg and is not curbing his appetite. He gained more weight since last  visit up, 12 lbs in the past 6 months . He works 6 days a week and is unable to exercise, he has been cutting down on soda intake, he also states Rybelsus curbs his appetite. . He has increase leafy vegetables. His weight is unchanged    Metabolic syndrome: history of hypertension, elevated triglycerides, increase abomnal girth. He is taking Rybelsus now at 14 mg daily, Last A1C was 5.7 %, we will Metformin today    Perennial Allergic Rhinitis  he states symptoms are well controlled at this time, occasional sneezing, nasal congestion or rhinorrhea. Unchanged    Low Testosterone and ED : under the care of Dr. Trish Mage, unable to afford Clomiphene but he is taking Cialis instead of Viagra and seems to work better for him. Discussed GoodRx to see if able to pay for Clomiphene   COVID-19 history : diagnosed on 03/04/2020 at [facility] treated with doxy, prednisone and codeine and guanfenicin, he had to go back to have a negative test prior to going back to work, repeat test was  negative, he was diagnosed  with bronchitis and was given antibiotics  Steroids. His heart rate has been elevated since, he went to see cardiologist but did not start Cardizem, he is willing to try low dose Atenolol at night   Patient Active Problem List   Diagnosis Date Noted   Tachycardia 08/08/2017   Hypertriglyceridemia 05/28/2016   Hypertension 11/24/2015   Perennial allergic rhinitis with seasonal variation 12/10/2014   Asthma, moderate persistent 12/10/2014   Dyslipidemia 12/10/2014   Obesity, Class III, BMI 40-49.9 (morbid obesity) (HCC) 12/10/2014    Past Surgical History:  Procedure Laterality Date   TONSILECTOMY/ADENOIDECTOMY WITH MYRINGOTOMY      Family History  Problem Relation Age of Onset   Hyperlipidemia Mother    Hypertension Mother    Hypothyroidism Mother    Heart block Mother    Benign prostatic hyperplasia Father    Prostatitis Father        Had prostate removed due to being enlarged   Hyperlipidemia Sister    Hypertension Sister    Asthma Sister    CAD Maternal Grandfather    Hypertension Maternal Grandfather    Psoriasis Maternal Grandfather    Dementia Maternal Grandfather    Congestive Heart Failure Paternal Grandfather    Diabetes  Paternal Grandfather    Arthritis Paternal Grandfather    Kidney disease Paternal Grandfather    Diabetes Maternal Grandmother    Arthritis Maternal Grandmother    Diabetes Paternal Grandmother    Arthritis Paternal Grandmother    Hypertension Sister    Hyperlipidemia Sister    Asthma Sister     Social History   Tobacco Use   Smoking status: Never   Smokeless tobacco: Former    Types: Chew    Quit date: 11/29/2017  Substance Use Topics   Alcohol use: No    Alcohol/week: 0.0 standard drinks     Current Outpatient Medications:    albuterol (VENTOLIN HFA) 108 (90 Base) MCG/ACT inhaler, INHALE 2 PUFFS BY MOUTH EVERY 6 HOURS AS NEEDED FOR WHEEZING AND SHORTNESS OF BREATH, Disp: 8.5 each, Rfl: 0   Alpha Lipoic Acid 200 MG CAPS, Take by  mouth daily., Disp: , Rfl:    atenolol (TENORMIN) 25 MG tablet, Take 1 tablet (25 mg total) by mouth daily., Disp: 90 tablet, Rfl: 0   azelastine (ASTELIN) 0.1 % nasal spray, Place 1 spray into both nostrils as needed., Disp: 30 mL, Rfl: 1   budesonide-formoterol (SYMBICORT) 160-4.5 MCG/ACT inhaler, Inhale 2 puffs into the lungs 2 (two) times daily., Disp: 30.6 each, Rfl: 1   cetirizine (ZYRTEC) 10 MG tablet, Take 10 mg by mouth daily., Disp: , Rfl:    Cholecalciferol (VITAMIN D) 50 MCG (2000 UT) CAPS, Take 1 capsule by mouth daily., Disp: , Rfl:    clomiPHENE (CLOMID) 50 MG tablet, Take 0.5 tablets (25 mg total) by mouth daily., Disp: 30 tablet, Rfl: 6   Cyanocobalamin (VITAMIN B 12 PO), Take 1,000 mg by mouth daily., Disp: , Rfl:    fluticasone (FLONASE) 50 MCG/ACT nasal spray, Place 2 sprays into both nostrils as needed for allergies., Disp: 16 g, Rfl: 1   montelukast (SINGULAIR) 10 MG tablet, Take 1 tablet (10 mg total) by mouth daily., Disp: 90 tablet, Rfl: 1   olmesartan (BENICAR) 40 MG tablet, Take 1 tablet (40 mg total) by mouth daily., Disp: 90 tablet, Rfl: 0   Semaglutide (RYBELSUS) 14 MG TABS, Take 14 mg by mouth daily., Disp: 90 tablet, Rfl: 1   tadalafil (CIALIS) 10 MG tablet, Take 1 tablet (10 mg total) by mouth every other day., Disp: 30 tablet, Rfl: 11   sildenafil (VIAGRA) 100 MG tablet, Take 0.5-1 tablets (50-100 mg total) by mouth daily as needed for erectile dysfunction. (Patient not taking: Reported on 03/24/2021), Disp: 30 tablet, Rfl: 1  Allergies  Allergen Reactions   Aspirin    Tetanus Toxoids Other (See Comments)    Flared up Asthma    I personally reviewed active problem list, medication list, allergies, family history, social history, health maintenance with the patient/caregiver today.   ROS  Constitutional: Negative for fever or weight change.  Respiratory: Negative for cough and shortness of breath.   Cardiovascular: Negative for chest pain or palpitations.   Gastrointestinal: Negative for abdominal pain, no bowel changes.  Musculoskeletal: Negative for gait problem or joint swelling.  Skin: Negative for rash.  Neurological: Negative for dizziness or headache.  No other specific complaints in a complete review of systems (except as listed in HPI above).   Objective  Vitals:   03/24/21 0926  BP: 132/82  Pulse: 91  Resp: 16  SpO2: 96%  Weight: 285 lb (129.3 kg)  Height: 5\' 7"  (1.702 m)    Body mass index is 44.64 kg/m.  Physical Exam  Constitutional: Patient appears well-developed and well-nourished. Obese  No distress.  HEENT: head atraumatic, normocephalic, pupils equal and reactive to light, neck supple Cardiovascular: Normal rate, regular rhythm and normal heart sounds.  No murmur heard. No BLE edema. Pulmonary/Chest: Effort normal and breath sounds normal. No respiratory distress. Abdominal: Soft.  There is no tenderness. Psychiatric: Patient has a normal mood and affect. behavior is normal. Judgment and thought content normal.   PHQ2/9: Depression screen [facility] 2/9 03/24/2021 12/22/2020 04/08/2020 01/04/2020 03/23/2019  Decreased Interest 0 0 0 0 0  Down, Depressed, Hopeless 0 0 0 0 0  PHQ - 2 Score 0 0 0 0 0  Altered sleeping 0 0 - - 0  Tired, decreased energy 0 0 - - 0  Change in appetite 0 0 - - 0  Feeling bad or failure about yourself  0 0 - - 0  Trouble concentrating 0 0 - - 0  Moving slowly or fidgety/restless 0 0 - - 0  Suicidal thoughts 0 0 - - 0  PHQ-9 Score 0 0 - - 0  Difficult doing work/chores - - - - Not difficult at all    phq 9 is negative   Fall Risk: Fall Risk  03/24/2021 12/22/2020 04/08/2020 01/04/2020 03/23/2019  Falls in the past year? 0 0 0 0 0  Number falls in past yr: 0 0 0 0 0  Injury with Fall? 0 0 0 0 0  Comment - - - - -  Risk for fall due to : No Fall Risks No Fall Risks - - -  Follow up Falls prevention discussed Falls prevention discussed - - -      Functional Status Survey: Is the patient  deaf or have difficulty hearing?: Yes Does the patient have difficulty seeing, even when wearing glasses/contacts?: Yes Does the patient have difficulty concentrating, remembering, or making decisions?: No Does the patient have difficulty walking or climbing stairs?: No Does the patient have difficulty dressing or bathing?: No Does the patient have difficulty doing errands alone such as visiting a doctor's office or shopping?: No    Assessment & Plan  1. Moderate persistent asthma without complication   2. Obesity, Class III, BMI 40-49.9 (morbid obesity) (HCC)  Discussed with the patient the risk posed by an increased BMI. Discussed importance of portion control, calorie counting and at least 150 minutes of physical activity weekly. Avoid sweet beverages and drink more water. Eat at least 6 servings of fruit and vegetables daily    3. Dyslipidemia   4. Metabolic syndrome  - metFORMIN (GLUCOPHAGE XR) 500 MG 24 hr tablet; Take 1 tablet (500 mg total) by mouth daily with breakfast.  Dispense: 90 tablet; Refill: 1  5. Perennial allergic rhinitis with seasonal variation   6. Low testosterone in male   7. Other male erectile dysfunction   8. Hypertriglyceridemia   9. Essential hypertension  Changing to valsartan due to cost of olmasartan    10. Tachycardia  - atenolol (TENORMIN) 25 MG tablet; Take 1 tablet (25 mg total) by mouth daily.  Dispense: 90 tablet; Refill: 1

## 2021-03-24 ENCOUNTER — Ambulatory Visit: Payer: 59 | Admitting: Family Medicine

## 2021-03-24 ENCOUNTER — Encounter: Payer: Self-pay | Admitting: Family Medicine

## 2021-03-24 VITALS — BP 132/82 | HR 91 | Resp 16 | Ht 67.0 in | Wt 285.0 lb

## 2021-03-24 DIAGNOSIS — E781 Pure hyperglyceridemia: Secondary | ICD-10-CM

## 2021-03-24 DIAGNOSIS — E8881 Metabolic syndrome: Secondary | ICD-10-CM | POA: Diagnosis not present

## 2021-03-24 DIAGNOSIS — J4540 Moderate persistent asthma, uncomplicated: Secondary | ICD-10-CM | POA: Diagnosis not present

## 2021-03-24 DIAGNOSIS — N528 Other male erectile dysfunction: Secondary | ICD-10-CM

## 2021-03-24 DIAGNOSIS — E785 Hyperlipidemia, unspecified: Secondary | ICD-10-CM

## 2021-03-24 DIAGNOSIS — I10 Essential (primary) hypertension: Secondary | ICD-10-CM

## 2021-03-24 DIAGNOSIS — R000 Tachycardia, unspecified: Secondary | ICD-10-CM

## 2021-03-24 DIAGNOSIS — J302 Other seasonal allergic rhinitis: Secondary | ICD-10-CM

## 2021-03-24 DIAGNOSIS — J3089 Other allergic rhinitis: Secondary | ICD-10-CM

## 2021-03-24 DIAGNOSIS — R7989 Other specified abnormal findings of blood chemistry: Secondary | ICD-10-CM

## 2021-03-24 MED ORDER — METFORMIN HCL ER 500 MG PO TB24: 500.0000 mg | ORAL_TABLET | Freq: Every day | ORAL | 1 refills | Status: DC

## 2021-03-24 MED ORDER — VALSARTAN 320 MG PO TABS: 320.0000 mg | ORAL_TABLET | Freq: Every day | ORAL | 1 refills | Status: DC

## 2021-03-24 MED ORDER — ATENOLOL 25 MG PO TABS: 25.0000 mg | ORAL_TABLET | Freq: Every day | ORAL | 1 refills | Status: DC

## 2021-03-24 MED ORDER — OLMESARTAN MEDOXOMIL 40 MG PO TABS: 40.0000 mg | ORAL_TABLET | Freq: Every day | ORAL | 0 refills | Status: DC

## 2021-06-08 NOTE — Patient Instructions (Signed)

## 2021-06-08 NOTE — Progress Notes (Signed)
Name: Matthew Barrett   MRN: [id number]    DOB: [birth year]   Date:06/09/2021 ? ?     Progress Note ? ?Subjective ? ?Chief Complaint ? ?Annual Exam ? ?HPI ? ?Patient presents for annual CPE. ? ?IPSS Questionnaire (AUA-7): ?Over the past month?   ?1)  How often have you had a sensation of not emptying your bladder completely after you finish urinating?  0 - Not at all  ?2)  How often have you had to urinate again less than two hours after you finished urinating? 0 - Not at all  ?3)  How often have you found you stopped and started again several times when you urinated?  0 - Not at all  ?4) How difficult have you found it to postpone urination?  0 - Not at all  ?5) How often have you had a weak urinary stream?  0 - Not at all  ?6) How often have you had to push or strain to begin urination?  0 - Not at all  ?7) How many times did you most typically get up to urinate from the time you went to bed until the time you got up in the morning?  0 - None  ?Total score:  0-7 mildly symptomatic  ? 8-19 moderately symptomatic  ? 20-35 severely symptomatic  ?  ? ?Diet: cooking at home, eat out rarely, packs lunch for work.  Trying to cut down on carbohydrates now  ?Exercise: he has a physical job.  ? ?Depression: phq 9 is negative ? ?  06/09/2021  ?  3:20 PM 03/24/2021  ?  9:22 AM 12/22/2020  ? 10:17 AM 04/08/2020  ?  3:00 PM 01/04/2020  ?  1:18 PM  ?Depression screen PHQ 2/9  ?Decreased Interest 0 0 0 0 0  ?Down, Depressed, Hopeless 0 0 0 0 0  ?PHQ - 2 Score 0 0 0 0 0  ?Altered sleeping 0 0 0    ?Tired, decreased energy 0 0 0    ?Change in appetite 0 0 0    ?Feeling bad or failure about yourself  0 0 0    ?Trouble concentrating 0 0 0    ?Moving slowly or fidgety/restless 0 0 0    ?Suicidal thoughts 0 0 0    ?PHQ-9 Score 0 0 0    ? ? ?Hypertension:  ?BP Readings from Last 3 Encounters:  ?06/09/21 128/74  ?03/24/21 132/82  ?01/14/21 123/79  ? ? ?Obesity: ?Wt Readings from Last 3 Encounters:  ?06/09/21 281 lb (127.5 kg)  ?03/24/21 285 lb  (129.3 kg)  ?01/14/21 285 lb (129.3 kg)  ? ?BMI Readings from Last 3 Encounters:  ?06/09/21 44.01 kg/m?  ?03/24/21 44.64 kg/m?  ?01/14/21 44.64 kg/m?  ?  ? ?Lipids:  ?Lab Results  ?Component Value Date  ? CHOL 213 (H) 12/22/2020  ? CHOL 199 01/09/2020  ? CHOL 215 (H) 11/30/2018  ? ?Lab Results  ?Component Value Date  ? HDL 51 12/22/2020  ? HDL 44 01/09/2020  ? HDL 47 11/30/2018  ? ?Lab Results  ?Component Value Date  ? LDLCALC 125 (H) 12/22/2020  ? LDLCALC 126 (H) 01/09/2020  ? LDLCALC 136 (H) 11/30/2018  ? ?Lab Results  ?Component Value Date  ? TRIG 228 (H) 12/22/2020  ? TRIG 170 (H) 01/09/2020  ? TRIG 180 (H) 11/30/2018  ? ?Lab Results  ?Component Value Date  ? CHOLHDL 4.2 12/22/2020  ? CHOLHDL 4.5 01/09/2020  ? CHOLHDL 4.6 11/30/2018  ? ?No results  found for: LDLDIRECT ?Glucose:  ?Glucose, Bld  ?Date Value Ref Range Status  ?12/22/2020 86 65 - 99 mg/dL Final  ?  Comment:  ?  . ?           Fasting reference interval ?. ?  ?01/09/2020 105 (H) 65 - 99 mg/dL Final  ?  Comment:  ?  . ?           Fasting reference interval ?. ?For someone without known diabetes, a glucose value ?between 100 and 125 mg/dL is consistent with ?prediabetes and should be confirmed with a ?follow-up test. ?. ?  ?11/30/2018 105 (H) 65 - 99 mg/dL Final  ?  Comment:  ?  . ?           Fasting reference interval ?. ?For someone without known diabetes, a glucose value ?between 100 and 125 mg/dL is consistent with ?prediabetes and should be confirmed with a ?follow-up test. ?. ?  ? ? ?Flow[facility] Row [facility]  ?AUDIT-C Score 0  ? ?  ? ? ?Married ?STD testing and prevention (HIV/chl/gon/syphilis):  11/30/18 ?Sexual history: married one partner  ?Hep C Screening: 11/30/18 ?Skin cancer: Discussed monitoring for atypical lesions ?Colorectal cancer: he prefers starting screening at age 50  ?Prostate cancer:   ?Lab Results  ?Component Value Date  ? PSA 0.3 12/09/2011  ? ? ? ?Lung cancer:  Low Dose CT  Chest recommended if Age 72-80 years, 30 pack-year currently smoking OR have quit w/in 15years. Patient  no a candidate for screening   ?AAA: The USPSTF recommends one-time screening with ultrasonography in men ages 4865 to 75 years who have ever smoked. Patient  is not a candidate for screening  ?ECG:  04/21/20 ? ?Vaccines:  ? ?HPV: N/A ?Tdap: he is allergic  ?Shingrix: N/A ?Pneumonia: up to date ?Flu: up to date ?COVID-19: up to date ? ?Advanced Care Planning: A voluntary discussion about advance care planning including the explanation and discussion of advance directives.  Discussed health care proxy and Living will, and the patient was able to identify a health care proxy as wife.  Patient does not have a living will and power of attorney of health care  ? ?Patient Active Problem List  ? Diagnosis Date Noted  ? Tachycardia 08/08/2017  ? Hypertriglyceridemia 05/28/2016  ? Hypertension 11/24/2015  ? Perennial allergic rhinitis with seasonal variation 12/10/2014  ? Asthma, moderate persistent 12/10/2014  ? Dyslipidemia 12/10/2014  ? Obesity, Class III, BMI 40-49.9 (morbid obesity) (HCC) 12/10/2014  ? ? ?Past Surgical History:  ?Procedure Laterality Date  ? TONSILECTOMY/ADENOIDECTOMY WITH MYRINGOTOMY    ? ? ?Family History  ?Problem Relation Age of Onset  ? Hyperlipidemia Mother   ? Hypertension Mother   ? Hypothyroidism Mother   ? Heart block Mother   ? Benign prostatic hyperplasia Father   ? Prostatitis Father   ?     Had prostate removed due to being enlarged  ? Hyperlipidemia Sister   ? Hypertension Sister   ? Asthma Sister   ? CAD Maternal Grandfather   ? Hypertension Maternal Grandfather   ? Psoriasis Maternal Grandfather   ? Dementia Maternal Grandfather   ? Congestive Heart Failure Paternal Grandfather   ? Diabetes Paternal Grandfather   ? Arthritis Paternal Grandfather   ? Kidney disease Paternal Grandfather   ? Diabetes Maternal Grandmother   ? Arthritis Maternal Grandmother   ? Diabetes Paternal Grandmother    ? Arthritis Paternal Grandmother   ?  Hypertension Sister   ? Hyperlipidemia Sister   ? Asthma Sister   ? ? ?Social History  ? ?Socioeconomic History  ? Marital status: Married  ?  Spouse name: Rinaldo Cloud  ? Number of children: 0  ? Years of education: Not on file  ? Highest education level: 12th grade  ?Occupational History  ? Not on file  ?Tobacco Use  ? Smoking status: Never  ? Smokeless tobacco: Former  ?  Types: Chew  ?  Quit date: 11/29/2017  ?Vaping Use  ? Vaping Use: Never used  ?Substance and Sexual Activity  ? Alcohol use: No  ?  Alcohol/week: 0.0 standard drinks  ? Drug use: No  ? Sexual activity: Yes  ?  Partners: Female  ?Other Topics Concern  ? Not on file  ?Social History Narrative  ? Married, no children  ? ?Social Determinants of Health  ? ?Financial Resource Strain: Low Risk   ? Difficulty of Paying Living Expenses: Not hard at all  ?Food Insecurity: No Food Insecurity  ? Worried About Programme researcher, broadcasting/film/video in the Last Year: Never true  ? Ran Out of Food in the Last Year: Never true  ?Transportation Needs: No Transportation Needs  ? Lack of Transportation (Medical): No  ? Lack of Transportation (Non-Medical): No  ?Physical Activity: Insufficiently Active  ? Days of Exercise per Week: 3 days  ? Minutes of Exercise per Session: 30 min  ?Stress: No Stress Concern Present  ? Feeling of Stress : Not at all  ?Social Connections: Moderately Integrated  ? Frequency of Communication with Friends and Family: More than three times a week  ? Frequency of Social Gatherings with Friends and Family: Once a week  ? Attends Religious Services: More than 4 times per year  ? Active Member of Clubs or Organizations: No  ? Attends Banker Meetings: Never  ? Marital Status: Married  ?Intimate Partner Violence: Not At Risk  ? Fear of Current or Ex-Partner: No  ? Emotionally Abused: No  ? Physically Abused: No  ? Sexually Abused: No  ? ? ? ?Current Outpatient Medications:  ?  albuterol (VENTOLIN HFA) 108 (90 Base)  MCG/ACT inhaler, INHALE 2 PUFFS BY MOUTH EVERY 6 HOURS AS NEEDED FOR WHEEZING AND SHORTNESS OF BREATH, Disp: 8.5 each, Rfl: 0 ?  Alpha Lipoic Acid 200 MG CAPS, Take by mouth daily., Disp: , Rfl:  ?  ateno

## 2021-06-09 ENCOUNTER — Ambulatory Visit (INDEPENDENT_AMBULATORY_CARE_PROVIDER_SITE_OTHER): Payer: 59 | Admitting: Family Medicine

## 2021-06-09 ENCOUNTER — Encounter: Payer: Self-pay | Admitting: Family Medicine

## 2021-06-09 VITALS — BP 128/74 | HR 92 | Resp 16 | Ht 67.0 in | Wt 281.0 lb

## 2021-06-09 DIAGNOSIS — Z0000 Encounter for general adult medical examination without abnormal findings: Secondary | ICD-10-CM

## 2021-06-09 DIAGNOSIS — Z1211 Encounter for screening for malignant neoplasm of colon: Secondary | ICD-10-CM | POA: Diagnosis not present

## 2021-06-11 ENCOUNTER — Telehealth: Payer: Self-pay

## 2021-06-11 NOTE — Telephone Encounter (Signed)
CALLED PATIENT NO ANSWER LEFT VOICEMAIL FOR A CALL BACK ? ?

## 2021-06-12 ENCOUNTER — Telehealth: Payer: Self-pay

## 2021-06-12 NOTE — Telephone Encounter (Signed)
CALLED PATIENT NO ANSWER LEFT VOICEMAIL FOR A CALL BACK °Letter sent °

## 2021-06-12 NOTE — Telephone Encounter (Signed)
LVM for pt to return my call.   Thanks,  Cortez Steelman, CMA 

## 2021-07-02 ENCOUNTER — Other Ambulatory Visit: Payer: Self-pay | Admitting: Family Medicine

## 2021-07-02 DIAGNOSIS — J4540 Moderate persistent asthma, uncomplicated: Secondary | ICD-10-CM

## 2021-07-02 DIAGNOSIS — J302 Other seasonal allergic rhinitis: Secondary | ICD-10-CM

## 2021-07-07 ENCOUNTER — Other Ambulatory Visit: Payer: Self-pay | Admitting: Family Medicine

## 2021-07-15 ENCOUNTER — Emergency Department
Admission: EM | Admit: 2021-07-15 | Discharge: 2021-07-15 | Disposition: A | Discharge status: Home / Self Care | Payer: 59 | Attending: Emergency Medicine | Admitting: Emergency Medicine

## 2021-07-15 ENCOUNTER — Emergency Department: Payer: 59

## 2021-07-15 ENCOUNTER — Other Ambulatory Visit: Payer: Self-pay

## 2021-07-15 DIAGNOSIS — H1032 Unspecified acute conjunctivitis, left eye: Secondary | ICD-10-CM | POA: Diagnosis not present

## 2021-07-15 DIAGNOSIS — J45909 Unspecified asthma, uncomplicated: Secondary | ICD-10-CM | POA: Insufficient documentation

## 2021-07-15 DIAGNOSIS — R519 Headache, unspecified: Secondary | ICD-10-CM | POA: Diagnosis not present

## 2021-07-15 DIAGNOSIS — H5712 Ocular pain, left eye: Secondary | ICD-10-CM | POA: Diagnosis present

## 2021-07-15 DIAGNOSIS — I10 Essential (primary) hypertension: Secondary | ICD-10-CM | POA: Insufficient documentation

## 2021-07-15 LAB — CBC WITH DIFFERENTIAL/PLATELET
Abs Immature Granulocytes: 0.02 10*3/uL (ref 0.00–0.07)
Basophils Absolute: 0 10*3/uL (ref 0.0–0.1)
Basophils Relative: 1 %
Eosinophils Absolute: 0.3 10*3/uL (ref 0.0–0.5)
Eosinophils Relative: 4 %
HCT: 46.4 % (ref 39.0–52.0)
Hemoglobin: 14.7 g/dL (ref 13.0–17.0)
Immature Granulocytes: 0 %
Lymphocytes Relative: 40 %
Lymphs Abs: 2.7 10*3/uL (ref 0.7–4.0)
MCH: 29.6 pg (ref 26.0–34.0)
MCHC: 31.7 g/dL (ref 30.0–36.0)
MCV: 93.5 fL (ref 80.0–100.0)
Monocytes Absolute: 0.6 10*3/uL (ref 0.1–1.0)
Monocytes Relative: 9 %
Neutro Abs: 3.1 10*3/uL (ref 1.7–7.7)
Neutrophils Relative %: 46 %
Platelets: 296 10*3/uL (ref 150–400)
RBC: 4.96 MIL/uL (ref 4.22–5.81)
RDW: 12.8 % (ref 11.5–15.5)
WBC: 6.7 10*3/uL (ref 4.0–10.5)
nRBC: 0 % (ref 0.0–0.2)

## 2021-07-15 LAB — CHLAMYDIA/NGC RT PCR (ARMC ONLY)
Chlamydia Tr: NOT DETECTED
N gonorrhoeae: NOT DETECTED

## 2021-07-15 LAB — SEDIMENTATION RATE: Sed Rate: 1 mm/hr (ref 0–15)

## 2021-07-15 LAB — BASIC METABOLIC PANEL
Anion gap: 6 (ref 5–15)
BUN: 12 mg/dL (ref 6–20)
CO2: 26 mmol/L (ref 22–32)
Calcium: 9.1 mg/dL (ref 8.9–10.3)
Chloride: 105 mmol/L (ref 98–111)
Creatinine, Ser: 1.12 mg/dL (ref 0.61–1.24)
GFR, Estimated: >60 mL/min (ref >60)
Glucose, Bld: 93 mg/dL (ref 70–99)
Potassium: 4.9 mmol/L (ref 3.5–5.1)
Sodium: 137 mmol/L (ref 135–145)

## 2021-07-15 MED ORDER — TETRACAINE HCL 0.5 % OP SOLN
1.0000 [drp] | Freq: Once | OPHTHALMIC | Status: AC
  Administered 2021-07-15: 1 [drp] via OPHTHALMIC
  Filled 2021-07-15: qty 4

## 2021-07-15 MED ORDER — NEOMYCIN-POLYMYXIN-DEXAMETH 3.5-10000-0.1 OP SUSP
2.0000 [drp] | Freq: Three times a day (TID) | OPHTHALMIC | 0 refills | Status: AC
Start: 2021-07-15 — End: 2021-07-29

## 2021-07-15 MED ORDER — IOHEXOL 300 MG/ML  SOLN
100.0000 mL | Freq: Once | INTRAMUSCULAR | Status: AC | PRN
Start: 2021-07-15 — End: 2021-07-15
  Administered 2021-07-15: 100 mL via INTRAVENOUS

## 2021-07-15 NOTE — ED Provider Notes (Signed)
[facility] Provider Note    Event Date/Time   First MD Initiated Contact with Patient 07/15/21 1242     (approximate)   History   Eye Pain and Headache   HPI  Matthew Barrett is a 51 y.o. male with a history of asthma, hypertension, and hyperlipidemia, who presents with left eye discomfort, discharge, and some swelling over the last few weeks.  The patient states that initially around a month ago he had redness and discharge from the left eye with mild swelling.  He was put on both an eye drop and an oral antibiotic.  The symptoms improved, and then recurred.  He then saw an eye doctor in the community and was put back on both the eyedrop and the oral antibiotic.  The symptoms resolved for about a week, but now they are back.  The patient reports mild blurred vision to that eye which has been the same during the whole course of this, and has not changed acutely in the last few days.  He has mild swelling of the eyelid.  He reports some occasional pain radiating to the left temple.  He states that his doctor followed up with him and told him to be evaluated for temporal arteritis.  The patient denies any vision loss.  He reports mild pain to the eye.  He has no photophobia.    Physical Exam   Triage Vital Signs: ED Triage Vitals  Enc Vitals Group     BP 07/15/21 1031 (!) 152/97     Pulse Rate 07/15/21 1031 78     Resp 07/15/21 1031 18     Temp 07/15/21 1031 97.8 F (36.6 C)     Temp Source 07/15/21 1031 Oral     SpO2 07/15/21 1031 95 %     Weight 07/15/21 1032 280 lb (127 kg)     Height 07/15/21 1032 5\' 7"  (1.702 m)     Head Circumference --      Peak Flow --      Pain Score 07/15/21 1032 4     Pain Loc --      Pain Edu? --      Excl. in GC? --     Most recent vital signs: Vitals:   07/15/21 1355 07/15/21 1430  BP: (!) 148/96 136/90  Pulse: 92 98  Resp: 18   Temp:    SpO2: 97% 97%     General: Awake, no distress.  CV:  Good peripheral  perfusion.  Resp:  Normal effort.  Abd:  No distention.  Other:  EOMI.  PERRLA.  Left eye with mild conjunctival injection, no swelling, no chemosis.  Trace eyelid swelling with no significant periorbital edema.  No temporal tenderness.   ED Results / Procedures / Treatments   Labs (all labs ordered are listed, but only abnormal results are displayed) Labs Reviewed  CHLAMYDIA/NGC RT PCR ([facility] ONLY)            BASIC METABOLIC PANEL  CBC WITH DIFFERENTIAL/PLATELET  SEDIMENTATION RATE     EKG     RADIOLOGY  CT orbit: I independently viewed and interpreted the images; there is no significant swelling or evidence of orbital or preorbital cellulitis or other acute complication.  Radiology read confirms no acute findings  PROCEDURES:  Critical Care performed: No  Procedures   MEDICATIONS ORDERED IN ED: Medications  tetracaine (PONTOCAINE) 0.5 % ophthalmic solution 1 drop (has no administration in time range)  iohexol (OMNIPAQUE) 300 MG/ML  solution 100 mL (100 mLs Intravenous Contrast Given 07/15/21 1502)     IMPRESSION / MDM / ASSESSMENT AND PLAN / ED COURSE  I reviewed the triage vital signs and the nursing notes.  51 year old male with PMH as noted above presents with recurrent left eye redness, discharge, and discomfort over the last month, now status post 2 episodes which were treated successfully with both oral and ophthalmic antibiotics.  On exam the patient is well-appearing.  His vital signs are normal.  Physical exam is overall unremarkable except for trace swelling to the eyelid and mild conjunctival injection.  I reviewed the past medical records.  I have no access to the patient's outpatient ophthalmology record.  He was last seen by his PMD on 03/24/2021 and is treated for hypertension and asthma.  Differential diagnosis includes, but is not limited to, bacterial or allergic conjunctivitis; I do not suspect iritis or uveitis based on the exam.  I have a very low  suspicion for temporal arteritis given the patient's age and the lack of temporal tenderness.  Patient's presentation is most consistent with acute complicated illness / injury requiring diagnostic workup.  We will obtain labs, CT orbits to rule out preseptal cellulitis, and consult ophthalmology.  ----------------------------------------- 3:59 PM on 07/15/2021 -----------------------------------------  Basic labs are normal.  The patient has no leukocytosis.  ESR is normal.  CT orbit shows no acute abnormality.  I checked the patient's eye pressure and it was 14.  I consulted Dr. Edison Pace from ophthalmology who advises that this is consistent with recurrent conjunctivitis.  He does not recommend any empiric treatment for temporal arteritis given the patient's age, lack of risk factors, and reassuring exam and labs.  He recommends starting the patient on Maxitrol eyedrops, obtaining a chlamydia swab as this can cause recurrent conjunctivitis, and he will follow-up with the patient early next week.  I counseled the patient on the results of the work-up and plan of care.  I gave him thorough return precautions and he expresses understanding.  FINAL CLINICAL IMPRESSION(S) / ED DIAGNOSES   Final diagnoses:  Acute conjunctivitis of left eye, unspecified acute conjunctivitis type     Rx / DC Orders   ED Discharge Orders          Ordered    neomycin-polymyxin b-dexamethasone (MAXITROL) 3.5-10000-0.1 SUSP  3 times daily        07/15/21 1546             Note:  This document was prepared using Dragon voice recognition software and may include unintentional dictation errors.    Arta Silence, MD 07/15/21 [phone]

## 2021-07-15 NOTE — Discharge Instructions (Addendum)
Use the eye drops as prescribed and finish the full course.  Follow-up with Dr. Brooke Dare in his [place] office on Tuesday at 8:15AM.  In the meantime, return to the ER for new, worsening, or persistent severe redness, discharge, swelling of the eye, vision loss or worsening blurring, swelling around the eye, fever, or any other new or worsening symptoms that concern you.

## 2021-07-15 NOTE — ED Triage Notes (Signed)
Pt reports eye infection to the left eye that was treated in April, pt states that the redness and swelling are starting to return, pt states that he now has swelling the left outer face and head pt was seen by his pcp who sent him to the ER with possible temporal arteritis

## 2021-07-30 NOTE — Progress Notes (Deleted)
Name: Matthew Barrett   MRN: [id number]    DOB: 02-Jul-1970   Date:07/30/2021       Progress Note  Subjective  Chief Complaint  Follow Up  HPI  Asthma: Currently using Symbicort BID and states asthma has been controlled , no wheezing, cough or SOB. Continue current regiment    HTN:  BP is at goal, no chest pain, palpitation or SOB. Heart rate controlled with Atenolol   Hyperlipidemia:   The 10-year ASCVD risk score (Arnett DK, et al., 2019) is: 4.1%   Values used to calculate the score:     Age: 51 years     Sex: Male     Is Non-Hispanic African American: No     Diabetic: No     Tobacco smoker: No     Systolic Blood Pressure: 132 mmHg     Is BP treated: Yes     HDL Cholesterol: 51 mg/dL     Total Cholesterol: 213 mg/dL    Morbid Obesity: he took Ozempic but not covered by insurance. He is now on Rybelsus 7 mg and is not curbing his appetite. He gained more weight since last  visit up, 12 lbs in the past 6 months . He works 6 days a week and is unable to exercise, he has been cutting down on soda intake, he also states Rybelsus curbs his appetite. . He has increase leafy vegetables. His weight is unchanged    Metabolic syndrome: history of hypertension, elevated triglycerides, increase abomnal girth. He is taking Rybelsus now at 14 mg daily, Last A1C was 5.7 %, we will Metformin today    Perennial Allergic Rhinitis  he states symptoms are well controlled at this time, occasional sneezing, nasal congestion or rhinorrhea. Unchanged    Low Testosterone and ED : under the care of Dr. Trish Mage, unable to afford Clomiphene but he is taking Cialis instead of Viagra and seems to work better for him. Discussed GoodRx to see if able to pay for Clomiphene   COVID-19 history : diagnosed on 03/04/2020 at [facility] treated with doxy, prednisone and codeine and guanfenicin, he had to go back to have a negative test prior to going back to work, repeat test was  negative, he was diagnosed  with bronchitis and was given antibiotics  Steroids. His heart rate has been elevated since, he went to see cardiologist but did not start Cardizem, he is willing to try low dose Atenolol at night   Patient Active Problem List   Diagnosis Date Noted   Tachycardia 08/08/2017   Hypertriglyceridemia 05/28/2016   Hypertension 11/24/2015   Perennial allergic rhinitis with seasonal variation 12/10/2014   Asthma, moderate persistent 12/10/2014   Dyslipidemia 12/10/2014   Obesity, Class III, BMI 40-49.9 (morbid obesity) (HCC) 12/10/2014    Past Surgical History:  Procedure Laterality Date   TONSILECTOMY/ADENOIDECTOMY WITH MYRINGOTOMY      Family History  Problem Relation Age of Onset   Hyperlipidemia Mother    Hypertension Mother    Hypothyroidism Mother    Heart block Mother    Benign prostatic hyperplasia Father    Prostatitis Father        Had prostate removed due to being enlarged   Hyperlipidemia Sister    Hypertension Sister    Asthma Sister    CAD Maternal Grandfather    Hypertension Maternal Grandfather    Psoriasis Maternal Grandfather    Dementia Maternal Grandfather    Congestive Heart Failure Paternal Grandfather    Diabetes  Paternal Grandfather    Arthritis Paternal Grandfather    Kidney disease Paternal Grandfather    Diabetes Maternal Grandmother    Arthritis Maternal Grandmother    Diabetes Paternal Grandmother    Arthritis Paternal Grandmother    Hypertension Sister    Hyperlipidemia Sister    Asthma Sister     Social History   Tobacco Use   Smoking status: Never   Smokeless tobacco: Former    Types: Chew    Quit date: 11/29/2017  Substance Use Topics   Alcohol use: No    Alcohol/week: 0.0 standard drinks of alcohol     Current Outpatient Medications:    albuterol (VENTOLIN HFA) 108 (90 Base) MCG/ACT inhaler, INHALE 2 PUFFS BY MOUTH EVERY 6 HOURS AS NEEDED FOR WHEEZING AND SHORTNESS OF BREATH, Disp: 8.5 each, Rfl: 0   Alpha Lipoic Acid 200 MG  CAPS, Take by mouth daily., Disp: , Rfl:    atenolol (TENORMIN) 25 MG tablet, Take 1 tablet (25 mg total) by mouth daily., Disp: 90 tablet, Rfl: 1   azelastine (ASTELIN) 0.1 % nasal spray, Place 1 spray into both nostrils as needed., Disp: 30 mL, Rfl: 1   budesonide-formoterol (SYMBICORT) 160-4.5 MCG/ACT inhaler, Inhale 2 puffs into the lungs 2 (two) times daily., Disp: 30.6 each, Rfl: 1   cetirizine (ZYRTEC) 10 MG tablet, Take 10 mg by mouth daily., Disp: , Rfl:    Cholecalciferol (VITAMIN D) 50 MCG (2000 UT) CAPS, Take 1 capsule by mouth daily., Disp: , Rfl:    clomiPHENE (CLOMID) 50 MG tablet, Take 0.5 tablets (25 mg total) by mouth daily., Disp: 30 tablet, Rfl: 6   Cyanocobalamin (VITAMIN B 12 PO), Take 1,000 mg by mouth daily., Disp: , Rfl:    fluticasone (FLONASE) 50 MCG/ACT nasal spray, Place 2 sprays into both nostrils as needed for allergies., Disp: 16 g, Rfl: 1   metFORMIN (GLUCOPHAGE XR) 500 MG 24 hr tablet, Take 1 tablet (500 mg total) by mouth daily with breakfast., Disp: 90 tablet, Rfl: 1   montelukast (SINGULAIR) 10 MG tablet, TAKE 1 TABLET BY MOUTH EVERY DAY, Disp: 90 tablet, Rfl: 1   RYBELSUS 14 MG TABS, TAKE 1 TABLET BY MOUTH EVERY DAY, Disp: 90 tablet, Rfl: 0   tadalafil (CIALIS) 10 MG tablet, Take 1 tablet (10 mg total) by mouth every other day., Disp: 30 tablet, Rfl: 11   valsartan (DIOVAN) 320 MG tablet, Take 1 tablet (320 mg total) by mouth daily. In place of Olmasartan, Disp: 90 tablet, Rfl: 1  Allergies  Allergen Reactions   Aspirin    Tetanus Toxoids Other (See Comments)    Flared up Asthma    I personally reviewed active problem list, medication list, allergies, family history, social history, health maintenance with the patient/caregiver today.   ROS  ***  Objective  There were no vitals filed for this visit.  There is no height or weight on file to calculate BMI.  Physical Exam ***  Recent Results (from the past 2160 hour(s))  Basic metabolic panel      Status: None   Collection Time: 07/15/21 10:35 AM  Result Value Ref Range   Sodium 137 135 - 145 mmol/L   Potassium 4.9 3.5 - 5.1 mmol/L   Chloride 105 98 - 111 mmol/L   CO2 26 22 - 32 mmol/L   Glucose, Bld 93 70 - 99 mg/dL    Comment: Glucose reference range applies only to samples taken after fasting for at least 8 hours.  BUN 12 6 - 20 mg/dL   Creatinine, Ser 7.86 0.61 - 1.24 mg/dL   Calcium 9.1 8.9 - 76.7 mg/dL   GFR, Estimated >20 >94 mL/min    Comment: (NOTE) Calculated using the CKD-EPI Creatinine Equation (2021)    Anion gap 6 5 - 15    Comment: Performed at [facility], [address] [place], [place] [address]  CBC with Differential     Status: None   Collection Time: 07/15/21 10:35 AM  Result Value Ref Range   WBC 6.7 4.0 - 10.5 K/uL   RBC 4.96 4.22 - 5.81 MIL/uL   Hemoglobin 14.7 13.0 - 17.0 g/dL   HCT 83.6 62.9 - 47.6 %   MCV 93.5 80.0 - 100.0 fL   MCH 29.6 26.0 - 34.0 pg   MCHC 31.7 30.0 - 36.0 g/dL   RDW 54.6 50.3 - 54.6 %   Platelets 296 150 - 400 K/uL   nRBC 0.0 0.0 - 0.2 %   Neutrophils Relative % 46 %   Neutro Abs 3.1 1.7 - 7.7 K/uL   Lymphocytes Relative 40 %   Lymphs Abs 2.7 0.7 - 4.0 K/uL   Monocytes Relative 9 %   Monocytes Absolute 0.6 0.1 - 1.0 K/uL   Eosinophils Relative 4 %   Eosinophils Absolute 0.3 0.0 - 0.5 K/uL   Basophils Relative 1 %   Basophils Absolute 0.0 0.0 - 0.1 K/uL   Immature Granulocytes 0 %   Abs Immature Granulocytes 0.02 0.00 - 0.07 K/uL    Comment: Performed at [facility], [address] [place], [place] [address]  Sedimentation rate     Status: None   Collection Time: 07/15/21 10:35 AM  Result Value Ref Range   Sed Rate 1 0 - 15 mm/hr    Comment: Performed at [facility], [address] [place], [place] [address]  Chlamydia/NGC rt PCR [facility] only)     Status: None   Collection Time: 07/15/21  3:41 PM   Specimen: Eye; Conjunctiva  Result Value Ref Range   Specimen source GC/Chlam  EYE    Chlamydia Tr NOT DETECTED NOT DETECTED   N gonorrhoeae NOT DETECTED NOT DETECTED    Comment: (NOTE) This CT/NG assay has not been evaluated in patients with a history of  hysterectomy. Performed at [facility], [address] [place], [place] [address]     PHQ2/9:    06/09/2021    3:20 PM 03/24/2021    9:22 AM 12/22/2020   10:17 AM 04/08/2020    3:00 PM 01/04/2020    1:18 PM  Depression screen PHQ 2/9  Decreased Interest 0 0 0 0 0  Down, Depressed, Hopeless 0 0 0 0 0  PHQ - 2 Score 0 0 0 0 0  Altered sleeping 0 0 0    Tired, decreased energy 0 0 0    Change in appetite 0 0 0    Feeling bad or failure about yourself  0 0 0    Trouble concentrating 0 0 0    Moving slowly or fidgety/restless 0 0 0    Suicidal thoughts 0 0 0    PHQ-9 Score 0 0 0      phq 9 is {gen pos [id number]}   Fall Risk:    06/09/2021    3:20 PM 03/24/2021    9:22 AM 12/22/2020   10:17 AM 04/08/2020    3:00 PM 01/04/2020    1:18 PM  Fall Risk  Falls in the past year? 0 0 0 0 0  Number falls in past yr: 0 0 0 0 0  Injury with Fall? 0 0 0 0 0  Risk for fall due to : No Fall Risks No Fall Risks No Fall Risks    Follow up Falls prevention discussed Falls prevention discussed Falls prevention discussed        Functional Status Survey:      Assessment & Plan  *** There are no diagnoses linked to this encounter.

## 2021-07-31 ENCOUNTER — Ambulatory Visit: Payer: 59 | Admitting: Family Medicine

## 2021-08-17 NOTE — Progress Notes (Deleted)
Name: Matthew Barrett   MRN: [id number]    DOB: 03/27/1970   Date:08/17/2021       Progress Note  Subjective  Chief Complaint  Follow Up  HPI  Asthma: Currently using Symbicort BID and states asthma has been controlled , no wheezing, cough or SOB. Continue current regiment    HTN:  BP is at goal, no chest pain, palpitation or SOB. Heart rate controlled with Atenolol   Hyperlipidemia:   The 10-year ASCVD risk score (Arnett DK, et al., 2019) is: 4.1%   Values used to calculate the score:     Age: 51 years     Sex: Male     Is Non-Hispanic African American: No     Diabetic: No     Tobacco smoker: No     Systolic Blood Pressure: 132 mmHg     Is BP treated: Yes     HDL Cholesterol: 51 mg/dL     Total Cholesterol: 213 mg/dL    Morbid Obesity: he took Ozempic but not covered by insurance. He is now on Rybelsus 7 mg and is not curbing his appetite. He gained more weight since last  visit up, 12 lbs in the past 6 months . He works 6 days a week and is unable to exercise, he has been cutting down on soda intake, he also states Rybelsus curbs his appetite. . He has increase leafy vegetables. His weight is unchanged    Metabolic syndrome: history of hypertension, elevated triglycerides, increase abomnal girth. He is taking Rybelsus now at 14 mg daily, Last A1C was 5.7 %, we will Metformin today    Perennial Allergic Rhinitis  he states symptoms are well controlled at this time, occasional sneezing, nasal congestion or rhinorrhea. Unchanged    Low Testosterone and ED : under the care of Dr. Trish Mage, unable to afford Clomiphene but he is taking Cialis instead of Viagra and seems to work better for him. Discussed GoodRx to see if able to pay for Clomiphene   COVID-19 history : diagnosed on 03/04/2020 at [facility] treated with doxy, prednisone and codeine and guanfenicin, he had to go back to have a negative test prior to going back to work, repeat test was  negative, he was diagnosed  with bronchitis and was given antibiotics  Steroids. His heart rate has been elevated since, he went to see cardiologist but did not start Cardizem, he is willing to try low dose Atenolol at night   Patient Active Problem List   Diagnosis Date Noted   Tachycardia 08/08/2017   Hypertriglyceridemia 05/28/2016   Hypertension 11/24/2015   Perennial allergic rhinitis with seasonal variation 12/10/2014   Asthma, moderate persistent 12/10/2014   Dyslipidemia 12/10/2014   Obesity, Class III, BMI 40-49.9 (morbid obesity) (HCC) 12/10/2014    Past Surgical History:  Procedure Laterality Date   TONSILECTOMY/ADENOIDECTOMY WITH MYRINGOTOMY      Family History  Problem Relation Age of Onset   Hyperlipidemia Mother    Hypertension Mother    Hypothyroidism Mother    Heart block Mother    Benign prostatic hyperplasia Father    Prostatitis Father        Had prostate removed due to being enlarged   Hyperlipidemia Sister    Hypertension Sister    Asthma Sister    CAD Maternal Grandfather    Hypertension Maternal Grandfather    Psoriasis Maternal Grandfather    Dementia Maternal Grandfather    Congestive Heart Failure Paternal Grandfather    Diabetes  Paternal Grandfather    Arthritis Paternal Grandfather    Kidney disease Paternal Grandfather    Diabetes Maternal Grandmother    Arthritis Maternal Grandmother    Diabetes Paternal Grandmother    Arthritis Paternal Grandmother    Hypertension Sister    Hyperlipidemia Sister    Asthma Sister     Social History   Tobacco Use   Smoking status: Never   Smokeless tobacco: Former    Types: Chew    Quit date: 11/29/2017  Substance Use Topics   Alcohol use: No    Alcohol/week: 0.0 standard drinks of alcohol     Current Outpatient Medications:    albuterol (VENTOLIN HFA) 108 (90 Base) MCG/ACT inhaler, INHALE 2 PUFFS BY MOUTH EVERY 6 HOURS AS NEEDED FOR WHEEZING AND SHORTNESS OF BREATH, Disp: 8.5 each, Rfl: 0   Alpha Lipoic Acid 200 MG  CAPS, Take by mouth daily., Disp: , Rfl:    atenolol (TENORMIN) 25 MG tablet, Take 1 tablet (25 mg total) by mouth daily., Disp: 90 tablet, Rfl: 1   azelastine (ASTELIN) 0.1 % nasal spray, Place 1 spray into both nostrils as needed., Disp: 30 mL, Rfl: 1   budesonide-formoterol (SYMBICORT) 160-4.5 MCG/ACT inhaler, Inhale 2 puffs into the lungs 2 (two) times daily., Disp: 30.6 each, Rfl: 1   cetirizine (ZYRTEC) 10 MG tablet, Take 10 mg by mouth daily., Disp: , Rfl:    Cholecalciferol (VITAMIN D) 50 MCG (2000 UT) CAPS, Take 1 capsule by mouth daily., Disp: , Rfl:    clomiPHENE (CLOMID) 50 MG tablet, Take 0.5 tablets (25 mg total) by mouth daily., Disp: 30 tablet, Rfl: 6   Cyanocobalamin (VITAMIN B 12 PO), Take 1,000 mg by mouth daily., Disp: , Rfl:    fluticasone (FLONASE) 50 MCG/ACT nasal spray, Place 2 sprays into both nostrils as needed for allergies., Disp: 16 g, Rfl: 1   metFORMIN (GLUCOPHAGE XR) 500 MG 24 hr tablet, Take 1 tablet (500 mg total) by mouth daily with breakfast., Disp: 90 tablet, Rfl: 1   montelukast (SINGULAIR) 10 MG tablet, TAKE 1 TABLET BY MOUTH EVERY DAY, Disp: 90 tablet, Rfl: 1   RYBELSUS 14 MG TABS, TAKE 1 TABLET BY MOUTH EVERY DAY, Disp: 90 tablet, Rfl: 0   tadalafil (CIALIS) 10 MG tablet, Take 1 tablet (10 mg total) by mouth every other day., Disp: 30 tablet, Rfl: 11   valsartan (DIOVAN) 320 MG tablet, Take 1 tablet (320 mg total) by mouth daily. In place of Olmasartan, Disp: 90 tablet, Rfl: 1  Allergies  Allergen Reactions   Aspirin    Tetanus Toxoids Other (See Comments)    Flared up Asthma    I personally reviewed active problem list, medication list, allergies, family history, social history, health maintenance with the patient/caregiver today.   ROS  ***  Objective  There were no vitals filed for this visit.  There is no height or weight on file to calculate BMI.  Physical Exam ***  Recent Results (from the past 2160 hour(s))  Basic metabolic panel      Status: None   Collection Time: 07/15/21 10:35 AM  Result Value Ref Range   Sodium 137 135 - 145 mmol/L   Potassium 4.9 3.5 - 5.1 mmol/L   Chloride 105 98 - 111 mmol/L   CO2 26 22 - 32 mmol/L   Glucose, Bld 93 70 - 99 mg/dL    Comment: Glucose reference range applies only to samples taken after fasting for at least 8 hours.  BUN 12 6 - 20 mg/dL   Creatinine, Ser 7.86 0.61 - 1.24 mg/dL   Calcium 9.1 8.9 - 76.7 mg/dL   GFR, Estimated >20 >94 mL/min    Comment: (NOTE) Calculated using the CKD-EPI Creatinine Equation (2021)    Anion gap 6 5 - 15    Comment: Performed at [facility], [address] [place], [place] [address]  CBC with Differential     Status: None   Collection Time: 07/15/21 10:35 AM  Result Value Ref Range   WBC 6.7 4.0 - 10.5 K/uL   RBC 4.96 4.22 - 5.81 MIL/uL   Hemoglobin 14.7 13.0 - 17.0 g/dL   HCT 83.6 62.9 - 47.6 %   MCV 93.5 80.0 - 100.0 fL   MCH 29.6 26.0 - 34.0 pg   MCHC 31.7 30.0 - 36.0 g/dL   RDW 54.6 50.3 - 54.6 %   Platelets 296 150 - 400 K/uL   nRBC 0.0 0.0 - 0.2 %   Neutrophils Relative % 46 %   Neutro Abs 3.1 1.7 - 7.7 K/uL   Lymphocytes Relative 40 %   Lymphs Abs 2.7 0.7 - 4.0 K/uL   Monocytes Relative 9 %   Monocytes Absolute 0.6 0.1 - 1.0 K/uL   Eosinophils Relative 4 %   Eosinophils Absolute 0.3 0.0 - 0.5 K/uL   Basophils Relative 1 %   Basophils Absolute 0.0 0.0 - 0.1 K/uL   Immature Granulocytes 0 %   Abs Immature Granulocytes 0.02 0.00 - 0.07 K/uL    Comment: Performed at [facility], [address] [place], [place] [address]  Sedimentation rate     Status: None   Collection Time: 07/15/21 10:35 AM  Result Value Ref Range   Sed Rate 1 0 - 15 mm/hr    Comment: Performed at [facility], [address] [place], [place] [address]  Chlamydia/NGC rt PCR [facility] only)     Status: None   Collection Time: 07/15/21  3:41 PM   Specimen: Eye; Conjunctiva  Result Value Ref Range   Specimen source GC/Chlam  EYE    Chlamydia Tr NOT DETECTED NOT DETECTED   N gonorrhoeae NOT DETECTED NOT DETECTED    Comment: (NOTE) This CT/NG assay has not been evaluated in patients with a history of  hysterectomy. Performed at [facility], [address] [place], [place] [address]     PHQ2/9:    06/09/2021    3:20 PM 03/24/2021    9:22 AM 12/22/2020   10:17 AM 04/08/2020    3:00 PM 01/04/2020    1:18 PM  Depression screen PHQ 2/9  Decreased Interest 0 0 0 0 0  Down, Depressed, Hopeless 0 0 0 0 0  PHQ - 2 Score 0 0 0 0 0  Altered sleeping 0 0 0    Tired, decreased energy 0 0 0    Change in appetite 0 0 0    Feeling bad or failure about yourself  0 0 0    Trouble concentrating 0 0 0    Moving slowly or fidgety/restless 0 0 0    Suicidal thoughts 0 0 0    PHQ-9 Score 0 0 0      phq 9 is {gen pos [id number]}   Fall Risk:    06/09/2021    3:20 PM 03/24/2021    9:22 AM 12/22/2020   10:17 AM 04/08/2020    3:00 PM 01/04/2020    1:18 PM  Fall Risk  Falls in the past year? 0 0 0 0 0  Number falls in past yr: 0 0 0 0 0  Injury with Fall? 0 0 0 0 0  Risk for fall due to : No Fall Risks No Fall Risks No Fall Risks    Follow up Falls prevention discussed Falls prevention discussed Falls prevention discussed        Functional Status Survey:      Assessment & Plan  *** There are no diagnoses linked to this encounter.

## 2021-08-19 ENCOUNTER — Ambulatory Visit: Payer: 59 | Admitting: Family Medicine

## 2021-09-22 ENCOUNTER — Ambulatory Visit: Payer: 59 | Admitting: Family Medicine

## 2021-09-22 ENCOUNTER — Encounter: Payer: Self-pay | Admitting: Family Medicine

## 2021-09-22 VITALS — BP 126/72 | HR 95 | Temp 97.9°F | Resp 16 | Ht 67.0 in | Wt 281.1 lb

## 2021-09-22 DIAGNOSIS — R000 Tachycardia, unspecified: Secondary | ICD-10-CM

## 2021-09-22 DIAGNOSIS — Z1211 Encounter for screening for malignant neoplasm of colon: Secondary | ICD-10-CM

## 2021-09-22 DIAGNOSIS — J4540 Moderate persistent asthma, uncomplicated: Secondary | ICD-10-CM | POA: Diagnosis not present

## 2021-09-22 DIAGNOSIS — I10 Essential (primary) hypertension: Secondary | ICD-10-CM | POA: Diagnosis not present

## 2021-09-22 DIAGNOSIS — E8881 Metabolic syndrome: Secondary | ICD-10-CM

## 2021-09-22 DIAGNOSIS — U099 Post covid-19 condition, unspecified: Secondary | ICD-10-CM

## 2021-09-22 DIAGNOSIS — J302 Other seasonal allergic rhinitis: Secondary | ICD-10-CM

## 2021-09-22 DIAGNOSIS — J3089 Other allergic rhinitis: Secondary | ICD-10-CM

## 2021-09-22 MED ORDER — VALSARTAN 320 MG PO TABS: 320.0000 mg | ORAL_TABLET | Freq: Every day | ORAL | 1 refills | Status: DC

## 2021-09-22 MED ORDER — RYBELSUS 14 MG PO TABS
1.0000 | ORAL_TABLET | Freq: Every day | ORAL | 1 refills | Status: DC
Start: 2021-09-22 — End: 2022-03-29

## 2021-09-22 MED ORDER — ATENOLOL 25 MG PO TABS: 25.0000 mg | ORAL_TABLET | Freq: Every day | ORAL | 1 refills | Status: DC

## 2021-09-22 MED ORDER — METFORMIN HCL ER 500 MG PO TB24
500.0000 mg | ORAL_TABLET | Freq: Every day | ORAL | 1 refills | Status: DC
Start: 2021-09-22 — End: 2022-03-29

## 2021-09-22 NOTE — Progress Notes (Signed)
Name: Matthew Barrett   MRN: [id number]    DOB: [birth year]   Date:09/22/2021       Progress Note  Subjective  Chief Complaint  Follow Up  HPI  Asthma: Currently using Symbicort BID and states asthma has been controlled , no wheezing, cough or SOB. Continue current regiment Unchanged    HTN:  BP is at goal, no chest pain, palpitation or SOB. Heart rate controlled with Atenolol . Continue current regiment   Hyperlipidemia:   The 10-year ASCVD risk score (Arnett DK, et al., 2019) is: 3.8%   Values used to calculate the score:     Age: 51 years     Sex: Male     Is Non-Hispanic African American: No     Diabetic: No     Tobacco smoker: No     Systolic Blood Pressure: 126 mmHg     Is BP treated: Yes     HDL Cholesterol: 51 mg/dL     Total Cholesterol: 213 mg/dL    Morbid Obesity: he took Ozempic but not covered by insurance. He is now on Rybelsus 14 , started Feb 2022 - weight was 279 lbs, last Fall it went up to 295 lbs, and today is down to 281.1 lbs . He works 6 days a week and is unable to exercise, he has been cutting down on soda intake, he is trying to eat smaller portions.    Metabolic syndrome: history of hypertension, elevated triglycerides, increase abomnal girth. He is taking Rybelsus now at 14 mg daily and also on Metformin , Last A1C was 5.7 %, continue  Perennial Allergic Rhinitis stable    Low Testosterone and ED : under the care of Dr. Trish Mage, unable to afford Clomiphene but he is taking Cialis instead of Viagra and seems to work better for him.Unchanged   COVID-19 history : diagnosed on 03/04/2020 at [facility] treated with doxy, prednisone and codeine and guanfenicin, he had to go back to have a negative test prior to going back to work, repeat test was  negative, he was diagnosed with bronchitis and was given antibiotics  Steroids. His heart rate has been elevated since, he went to see cardiologist but did not start Cardizem, he is now on Atenolol and  symptoms are controlled, heart rate is 95   Patient Active Problem List   Diagnosis Date Noted   Tachycardia 08/08/2017   Hypertriglyceridemia 05/28/2016   Hypertension 11/24/2015   Perennial allergic rhinitis with seasonal variation 12/10/2014   Asthma, moderate persistent 12/10/2014   Dyslipidemia 12/10/2014   Obesity, Class III, BMI 40-49.9 (morbid obesity) (HCC) 12/10/2014    Past Surgical History:  Procedure Laterality Date   TONSILECTOMY/ADENOIDECTOMY WITH MYRINGOTOMY      Family History  Problem Relation Age of Onset   Hyperlipidemia Mother    Hypertension Mother    Hypothyroidism Mother    Heart block Mother    Benign prostatic hyperplasia Father    Prostatitis Father        Had prostate removed due to being enlarged   Hyperlipidemia Sister    Hypertension Sister    Asthma Sister    CAD Maternal Grandfather    Hypertension Maternal Grandfather    Psoriasis Maternal Grandfather    Dementia Maternal Grandfather    Congestive Heart Failure Paternal Grandfather    Diabetes Paternal Grandfather    Arthritis Paternal Grandfather    Kidney disease Paternal Grandfather    Diabetes Maternal Grandmother    Arthritis Maternal Grandmother  Diabetes Paternal Grandmother    Arthritis Paternal Grandmother    Hypertension Sister    Hyperlipidemia Sister    Asthma Sister     Social History   Tobacco Use   Smoking status: Never   Smokeless tobacco: Former    Types: Chew    Quit date: 11/29/2017  Substance Use Topics   Alcohol use: No    Alcohol/week: 0.0 standard drinks of alcohol     Current Outpatient Medications:    albuterol (VENTOLIN HFA) 108 (90 Base) MCG/ACT inhaler, INHALE 2 PUFFS BY MOUTH EVERY 6 HOURS AS NEEDED FOR WHEEZING AND SHORTNESS OF BREATH, Disp: 8.5 each, Rfl: 0   Alpha Lipoic Acid 200 MG CAPS, Take by mouth daily., Disp: , Rfl:    atenolol (TENORMIN) 25 MG tablet, Take 1 tablet (25 mg total) by mouth daily., Disp: 90 tablet, Rfl: 1    azelastine (ASTELIN) 0.1 % nasal spray, Place 1 spray into both nostrils as needed., Disp: 30 mL, Rfl: 1   budesonide-formoterol (SYMBICORT) 160-4.5 MCG/ACT inhaler, Inhale 2 puffs into the lungs 2 (two) times daily., Disp: 30.6 each, Rfl: 1   cetirizine (ZYRTEC) 10 MG tablet, Take 10 mg by mouth daily., Disp: , Rfl:    Cholecalciferol (VITAMIN D) 50 MCG (2000 UT) CAPS, Take 1 capsule by mouth daily., Disp: , Rfl:    Cyanocobalamin (VITAMIN B 12 PO), Take 1,000 mg by mouth daily., Disp: , Rfl:    fluticasone (FLONASE) 50 MCG/ACT nasal spray, Place 2 sprays into both nostrils as needed for allergies., Disp: 16 g, Rfl: 1   metFORMIN (GLUCOPHAGE XR) 500 MG 24 hr tablet, Take 1 tablet (500 mg total) by mouth daily with breakfast., Disp: 90 tablet, Rfl: 1   montelukast (SINGULAIR) 10 MG tablet, TAKE 1 TABLET BY MOUTH EVERY DAY, Disp: 90 tablet, Rfl: 1   RYBELSUS 14 MG TABS, TAKE 1 TABLET BY MOUTH EVERY DAY, Disp: 90 tablet, Rfl: 0   tadalafil (CIALIS) 10 MG tablet, Take 1 tablet (10 mg total) by mouth every other day., Disp: 30 tablet, Rfl: 11   valsartan (DIOVAN) 320 MG tablet, Take 1 tablet (320 mg total) by mouth daily. In place of Olmasartan, Disp: 90 tablet, Rfl: 1  Allergies  Allergen Reactions   Aspirin    Tetanus Toxoids Other (See Comments)    Flared up Asthma    I personally reviewed active problem list, medication list, allergies, family history, social history, health maintenance with the patient/caregiver today.   ROS  Ten systems reviewed and is negative except as mentioned in HPI  Objective  Vitals:   09/22/21 1500  BP: 126/72  Pulse: 95  Resp: 16  Temp: 97.9 F (36.6 C)  TempSrc: Oral  SpO2: 100%  Weight: 281 lb 1.6 oz (127.5 kg)  Height: 5\' 7"  (1.702 m)    Body mass index is 44.03 kg/m.  Physical Exam  Constitutional: Patient appears well-developed and well-nourished. Obese  No distress.  HEENT: head atraumatic, normocephalic, pupils equal and reactive to  light, neck supple, Cardiovascular: Normal rate, regular rhythm and normal heart sounds.  No murmur heard. No BLE edema. Pulmonary/Chest: Effort normal and breath sounds normal. No respiratory distress. Abdominal: Soft.  There is no tenderness. Psychiatric: Patient has a normal mood and affect. behavior is normal. Judgment and thought content normal.   Recent Results (from the past 2160 hour(s))  Basic metabolic panel     Status: None   Collection Time: 07/15/21 10:35 AM  Result Value Ref Range  Sodium 137 135 - 145 mmol/L   Potassium 4.9 3.5 - 5.1 mmol/L   Chloride 105 98 - 111 mmol/L   CO2 26 22 - 32 mmol/L   Glucose, Bld 93 70 - 99 mg/dL    Comment: Glucose reference range applies only to samples taken after fasting for at least 8 hours.   BUN 12 6 - 20 mg/dL   Creatinine, Ser 4.09 0.61 - 1.24 mg/dL   Calcium 9.1 8.9 - 81.1 mg/dL   GFR, Estimated >91 >47 mL/min    Comment: (NOTE) Calculated using the CKD-EPI Creatinine Equation (2021)    Anion gap 6 5 - 15    Comment: Performed at [facility], [address] [place], [place] [address]  CBC with Differential     Status: None   Collection Time: 07/15/21 10:35 AM  Result Value Ref Range   WBC 6.7 4.0 - 10.5 K/uL   RBC 4.96 4.22 - 5.81 MIL/uL   Hemoglobin 14.7 13.0 - 17.0 g/dL   HCT 21.3 08.6 - 57.8 %   MCV 93.5 80.0 - 100.0 fL   MCH 29.6 26.0 - 34.0 pg   MCHC 31.7 30.0 - 36.0 g/dL   RDW 46.9 62.9 - 52.8 %   Platelets 296 150 - 400 K/uL   nRBC 0.0 0.0 - 0.2 %   Neutrophils Relative % 46 %   Neutro Abs 3.1 1.7 - 7.7 K/uL   Lymphocytes Relative 40 %   Lymphs Abs 2.7 0.7 - 4.0 K/uL   Monocytes Relative 9 %   Monocytes Absolute 0.6 0.1 - 1.0 K/uL   Eosinophils Relative 4 %   Eosinophils Absolute 0.3 0.0 - 0.5 K/uL   Basophils Relative 1 %   Basophils Absolute 0.0 0.0 - 0.1 K/uL   Immature Granulocytes 0 %   Abs Immature Granulocytes 0.02 0.00 - 0.07 K/uL    Comment: Performed at [facility], [address] [place], [place] [address]  Sedimentation rate     Status: None   Collection Time: 07/15/21 10:35 AM  Result Value Ref Range   Sed Rate 1 0 - 15 mm/hr    Comment: Performed at [facility], [address] [place], [place] [address]  Chlamydia/NGC rt PCR [facility] only)     Status: None   Collection Time: 07/15/21  3:41 PM   Specimen: Eye; Conjunctiva  Result Value Ref Range   Specimen source GC/Chlam EYE    Chlamydia Tr NOT DETECTED NOT DETECTED   N gonorrhoeae NOT DETECTED NOT DETECTED    Comment: (NOTE) This CT/NG assay has not been evaluated in patients with a history of  hysterectomy. Performed at [facility], [address] [place], [place] [address]     PHQ2/9:    09/22/2021    3:06 PM 06/09/2021    3:20 PM 03/24/2021    9:22 AM 12/22/2020   10:17 AM 04/08/2020    3:00 PM  Depression screen PHQ 2/9  Decreased Interest 0 0 0 0 0  Down, Depressed, Hopeless 0 0 0 0 0  PHQ - 2 Score 0 0 0 0 0  Altered sleeping 0 0 0 0   Tired, decreased energy 0 0 0 0   Change in appetite 0 0 0 0   Feeling bad or failure about yourself  0 0 0 0   Trouble concentrating 0 0 0 0   Moving slowly or fidgety/restless 0 0 0 0   Suicidal thoughts 0 0  0 0   PHQ-9 Score 0 0 0 0     phq 9 is negative   Fall Risk:    09/22/2021    2:59 PM 06/09/2021    3:20 PM 03/24/2021    9:22 AM 12/22/2020   10:17 AM 04/08/2020    3:00 PM  Fall Risk   Falls in the past year? 0 0 0 0 0  Number falls in past yr:  0 0 0 0  Injury with Fall?  0 0 0 0  Risk for fall due to : No Fall Risks No Fall Risks No Fall Risks No Fall Risks   Follow up Falls prevention discussed Falls prevention discussed Falls prevention discussed Falls prevention discussed       Functional Status Survey: Is the patient deaf or have difficulty hearing?: Yes Does the patient have difficulty seeing, even when wearing glasses/contacts?: Yes Does the patient have difficulty concentrating, remembering, or  making decisions?: No Does the patient have difficulty walking or climbing stairs?: No Does the patient have difficulty dressing or bathing?: No Does the patient have difficulty doing errands alone such as visiting a doctor's office or shopping?: No    Assessment & Plan  1. Essential hypertension  - valsartan (DIOVAN) 320 MG tablet; Take 1 tablet (320 mg total) by mouth daily. In place of Olmasartan  Dispense: 90 tablet; Refill: 1  2. Tachycardia  - atenolol (TENORMIN) 25 MG tablet; Take 1 tablet (25 mg total) by mouth daily.  Dispense: 90 tablet; Refill: 1  3. Metabolic syndrome  - metFORMIN (GLUCOPHAGE XR) 500 MG 24 hr tablet; Take 1 tablet (500 mg total) by mouth daily with breakfast.  Dispense: 90 tablet; Refill: 1  4. Moderate persistent asthma without complication  Continue Symbicort   5. Perennial allergic rhinitis with seasonal variation   6. 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  Dispense: 90 tablet; Refill: 1

## 2021-10-07 ENCOUNTER — Telehealth: Payer: 59 | Admitting: Physician Assistant

## 2021-10-07 NOTE — Progress Notes (Signed)
Patient was instructed earlier by one of our providers to seek in-person evaluation for suspected UTI in male as we cannot fully assess complicated UTI via virtual care due to need for exam and urine culture. Patient no-showed to visit to seek in person care.   No charge.

## 2021-11-02 ENCOUNTER — Ambulatory Visit (INDEPENDENT_AMBULATORY_CARE_PROVIDER_SITE_OTHER): Payer: 59

## 2021-11-02 DIAGNOSIS — Z23 Encounter for immunization: Secondary | ICD-10-CM

## 2021-11-11 ENCOUNTER — Other Ambulatory Visit: Payer: Self-pay | Admitting: Family Medicine

## 2021-11-11 DIAGNOSIS — J4540 Moderate persistent asthma, uncomplicated: Secondary | ICD-10-CM

## 2022-01-10 ENCOUNTER — Other Ambulatory Visit: Payer: Self-pay | Admitting: Family Medicine

## 2022-01-10 DIAGNOSIS — J4540 Moderate persistent asthma, uncomplicated: Secondary | ICD-10-CM

## 2022-01-10 DIAGNOSIS — J302 Other seasonal allergic rhinitis: Secondary | ICD-10-CM

## 2022-03-11 ENCOUNTER — Other Ambulatory Visit: Payer: Self-pay | Admitting: Family Medicine

## 2022-03-11 DIAGNOSIS — I10 Essential (primary) hypertension: Secondary | ICD-10-CM

## 2022-03-11 NOTE — Telephone Encounter (Signed)
Requested medication (s) are due for refill today: yes  Requested medication (s) are on the active medication list: yes  Last refill:  09/22/21 #90/1  Future visit scheduled: yes  Notes to clinic:  Unable to refill per protocol due to failed labs, no updated results.      Requested Prescriptions  Pending Prescriptions Disp Refills   valsartan (DIOVAN) 320 MG tablet 90 tablet 1    Sig: Take 1 tablet (320 mg total) by mouth daily. In place of Olmasartan     Cardiovascular:  Angiotensin Receptor Blockers Failed - 03/11/2022  2:52 PM      Failed - Cr in normal range and within 180 days    Creat  Date Value Ref Range Status  12/22/2020 1.34 (H) 0.60 - 1.29 mg/dL Final   Creatinine, Ser  Date Value Ref Range Status  07/15/2021 1.12 0.61 - 1.24 mg/dL Final         Failed - K in normal range and within 180 days    Potassium  Date Value Ref Range Status  07/15/2021 4.9 3.5 - 5.1 mmol/L Final         Passed - Patient is not pregnant      Passed - Last BP in normal range    BP Readings from Last 1 Encounters:  09/22/21 126/72         Passed - Valid encounter within last 6 months    Recent Outpatient Visits           5 months ago Essential hypertension   [facility] Steele Sizer, MD   9 months ago Well adult exam   [facility] Steele Sizer, MD   11 months ago Moderate persistent asthma without complication   [facility] Steele Sizer, MD   1 year ago Moderate persistent asthma without complication   [facility] Steele Sizer, MD   1 year ago [facility] Steele Sizer, MD       Future Appointments             In 2 weeks Steele Sizer, MD [facility], [facility]

## 2022-03-11 NOTE — Telephone Encounter (Signed)
Copied from [place] [id number]. Topic: General - Other >> Mar 11, 2022  2:05 PM Everette C wrote: Reason for CRM: Medication Refill - Medication: valsartan (DIOVAN) 320 MG tablet [[id number]]  Has the patient contacted their pharmacy? Yes.   (Agent: If no, request that the patient contact the pharmacy for the refill. If patient does not wish to contact the pharmacy document the reason why and proceed with request.) (Agent: If yes, when and what did the pharmacy advise?)  Preferred Pharmacy (with phone number or street name): [place], [place] - [address] [place] [address] Phone: [phone] Fax: [phone] Hours: Not open 24 hours   Has the patient been seen for an appointment in the last year OR does the patient have an upcoming appointment? Yes.    Agent: Please be advised that RX refills may take up to 3 business days. We ask that you follow-up with your pharmacy.

## 2022-03-12 MED ORDER — VALSARTAN 320 MG PO TABS: 320.0000 mg | ORAL_TABLET | Freq: Every day | ORAL | 0 refills | Status: DC

## 2022-03-26 NOTE — Progress Notes (Unsigned)
Name: Matthew Barrett   MRN: [id number]    DOB: [birth year]   Date:03/26/2022       Progress Note  Subjective  Chief Complaint  Follow Up  HPI  Asthma: Currently using Symbicort BID and states asthma has been controlled , no wheezing, cough or SOB. Continue current regiment Unchanged    HTN:  BP is at goal, no chest pain, palpitation or SOB. Heart rate controlled with Atenolol . Continue current regiment   Hyperlipidemia:   The 10-year ASCVD risk score (Arnett DK, et al., 2019) is: 3.8%   Values used to calculate the score:     Age: 52 years     Sex: Male     Is Non-Hispanic African American: No     Diabetic: No     Tobacco smoker: No     Systolic Blood Pressure: 123XX123 mmHg     Is BP treated: Yes     HDL Cholesterol: 51 mg/dL     Total Cholesterol: 213 mg/dL    Morbid Obesity: he took Ozempic but not covered by insurance. He is now on Rybelsus 14 , started Feb 2022 - weight was 279 lbs, last Fall it went up to 295 lbs, and today is down to 281.1 lbs . He works 6 days a week and is unable to exercise, he has been cutting down on soda intake, he is trying to eat smaller portions.    Metabolic syndrome: history of hypertension, elevated triglycerides, increase abomnal girth. He is taking Rybelsus now at 14 mg daily and also on Metformin , Last A1C was 5.7 %, continue  Perennial Allergic Rhinitis stable    Low Testosterone and ED : under the care of Dr. Franki Monte, unable to afford Clomiphene but he is taking Cialis instead of Viagra and seems to work better for him.Unchanged   COVID-19 history : diagnosed on 03/04/2020 at [facility] treated with doxy, prednisone and codeine and guanfenicin, he had to go back to have a negative test prior to going back to work, repeat test was  negative, he was diagnosed with bronchitis and was given antibiotics  Steroids. His heart rate has been elevated since, he went to see cardiologist but did not start Cardizem, he is now on Atenolol and  symptoms are controlled, heart rate is 95   Patient Active Problem List   Diagnosis Date Noted   Tachycardia 08/08/2017   Hypertriglyceridemia 05/28/2016   Hypertension 11/24/2015   Perennial allergic rhinitis with seasonal variation 12/10/2014   Asthma, moderate persistent 12/10/2014   Dyslipidemia 12/10/2014   Obesity, Class III, BMI 40-49.9 (morbid obesity) ([place]) 12/10/2014    Past Surgical History:  Procedure Laterality Date   TONSILECTOMY/ADENOIDECTOMY WITH MYRINGOTOMY      Family History  Problem Relation Age of Onset   Hyperlipidemia Mother    Hypertension Mother    Hypothyroidism Mother    Heart block Mother    Benign prostatic hyperplasia Father    Prostatitis Father        Had prostate removed due to being enlarged   Hyperlipidemia Sister    Hypertension Sister    Asthma Sister    CAD Maternal Grandfather    Hypertension Maternal Grandfather    Psoriasis Maternal Grandfather    Dementia Maternal Grandfather    Congestive Heart Failure Paternal Grandfather    Diabetes Paternal Grandfather    Arthritis Paternal Grandfather    Kidney disease Paternal Grandfather    Diabetes Maternal Grandmother    Arthritis Maternal Grandmother  Diabetes Paternal Grandmother    Arthritis Paternal Grandmother    Hypertension Sister    Hyperlipidemia Sister    Asthma Sister     Social History   Tobacco Use   Smoking status: Never   Smokeless tobacco: Former    Types: Chew    Quit date: 11/29/2017  Substance Use Topics   Alcohol use: No    Alcohol/week: 0.0 standard drinks of alcohol     Current Outpatient Medications:    albuterol (VENTOLIN HFA) 108 (90 Base) MCG/ACT inhaler, INHALE 2 PUFFS BY MOUTH EVERY 6 HOURS AS NEEDED FOR WHEEZING AND SHORTNESS OF BREATH, Disp: 8.5 each, Rfl: 0   Alpha Lipoic Acid 200 MG CAPS, Take by mouth daily., Disp: , Rfl:    atenolol (TENORMIN) 25 MG tablet, Take 1 tablet (25 mg total) by mouth daily., Disp: 90 tablet, Rfl: 1    azelastine (ASTELIN) 0.1 % nasal spray, Place 1 spray into both nostrils as needed., Disp: 30 mL, Rfl: 1   cetirizine (ZYRTEC) 10 MG tablet, Take 10 mg by mouth daily., Disp: , Rfl:    Cholecalciferol (VITAMIN D) 50 MCG (2000 UT) CAPS, Take 1 capsule by mouth daily., Disp: , Rfl:    Cyanocobalamin (VITAMIN B 12 PO), Take 1,000 mg by mouth daily., Disp: , Rfl:    fluticasone (FLONASE) 50 MCG/ACT nasal spray, Place 2 sprays into both nostrils as needed for allergies., Disp: 16 g, Rfl: 1   metFORMIN (GLUCOPHAGE XR) 500 MG 24 hr tablet, Take 1 tablet (500 mg total) by mouth daily with breakfast., Disp: 90 tablet, Rfl: 1   montelukast (SINGULAIR) 10 MG tablet, TAKE 1 TABLET BY MOUTH EVERY DAY, Disp: 90 tablet, Rfl: 1   Semaglutide (RYBELSUS) 14 MG TABS, Take 1 tablet (14 mg total) by mouth daily., Disp: 90 tablet, Rfl: 1   SYMBICORT 160-4.5 MCG/ACT inhaler, INHALE 2 PUFFS INTO THE LUNGS TWICE A DAY, Disp: 30.6 each, Rfl: 1   tadalafil (CIALIS) 10 MG tablet, Take 1 tablet (10 mg total) by mouth every other day., Disp: 30 tablet, Rfl: 11   valsartan (DIOVAN) 320 MG tablet, Take 1 tablet (320 mg total) by mouth daily. In place of Olmasartan, Disp: 90 tablet, Rfl: 0  Allergies  Allergen Reactions   Aspirin    Tetanus Toxoids Other (See Comments)    Flared up Asthma    I personally reviewed active problem list, medication list, allergies, family history, social history, health maintenance with the patient/caregiver today.   ROS  ***  Objective  There were no vitals filed for this visit.  There is no height or weight on file to calculate BMI.  Physical Exam ***  No results found for this or any previous visit (from the past 2160 hour(s)).   PHQ2/9:    09/22/2021    3:06 PM 06/09/2021    3:20 PM 03/24/2021    9:22 AM 12/22/2020   10:17 AM 04/08/2020    3:00 PM  Depression screen PHQ 2/9  Decreased Interest 0 0 0 0 0  Down, Depressed, Hopeless 0 0 0 0 0  PHQ - 2 Score 0 0 0 0 0  Altered  sleeping 0 0 0 0   Tired, decreased energy 0 0 0 0   Change in appetite 0 0 0 0   Feeling bad or failure about yourself  0 0 0 0   Trouble concentrating 0 0 0 0   Moving slowly or fidgety/restless 0 0 0 0   Suicidal  thoughts 0 0 0 0   PHQ-9 Score 0 0 0 0     phq 9 is {gen pos [id number]   Fall Risk:    09/22/2021    2:59 PM 06/09/2021    3:20 PM 03/24/2021    9:22 AM 12/22/2020   10:17 AM 04/08/2020    3:00 PM  [place] in the past year? 0 0 0 0 0  Number falls in past yr:  0 0 0 0  Injury with Fall?  0 0 0 0  Risk for fall due to : No Fall Risks No Fall Risks No Fall Risks No Fall Risks   Follow up Falls prevention discussed Falls prevention discussed Falls prevention discussed Falls prevention discussed       Functional Status Survey:      Assessment & Plan  *** There are no diagnoses linked to this encounter.

## 2022-03-29 ENCOUNTER — Ambulatory Visit: Payer: 59 | Admitting: Family Medicine

## 2022-03-29 ENCOUNTER — Encounter: Payer: Self-pay | Admitting: Family Medicine

## 2022-03-29 VITALS — BP 124/70 | HR 94 | Resp 16 | Ht 67.0 in | Wt 277.0 lb

## 2022-03-29 DIAGNOSIS — J4540 Moderate persistent asthma, uncomplicated: Secondary | ICD-10-CM

## 2022-03-29 DIAGNOSIS — U099 Post covid-19 condition, unspecified: Secondary | ICD-10-CM

## 2022-03-29 DIAGNOSIS — E8881 Metabolic syndrome: Secondary | ICD-10-CM

## 2022-03-29 DIAGNOSIS — N529 Male erectile dysfunction, unspecified: Secondary | ICD-10-CM

## 2022-03-29 DIAGNOSIS — E785 Hyperlipidemia, unspecified: Secondary | ICD-10-CM

## 2022-03-29 DIAGNOSIS — I10 Essential (primary) hypertension: Secondary | ICD-10-CM | POA: Diagnosis not present

## 2022-03-29 DIAGNOSIS — Z1211 Encounter for screening for malignant neoplasm of colon: Secondary | ICD-10-CM

## 2022-03-29 DIAGNOSIS — J302 Other seasonal allergic rhinitis: Secondary | ICD-10-CM

## 2022-03-29 DIAGNOSIS — R7989 Other specified abnormal findings of blood chemistry: Secondary | ICD-10-CM

## 2022-03-29 DIAGNOSIS — R000 Tachycardia, unspecified: Secondary | ICD-10-CM

## 2022-03-29 DIAGNOSIS — J3089 Other allergic rhinitis: Secondary | ICD-10-CM

## 2022-03-29 MED ORDER — RYBELSUS 14 MG PO TABS: 1.0000 | ORAL_TABLET | Freq: Every day | ORAL | 1 refills | Status: DC

## 2022-03-29 MED ORDER — BUDESONIDE-FORMOTEROL FUMARATE 160-4.5 MCG/ACT IN AERO: 2.0000 | INHALATION_SPRAY | Freq: Two times a day (BID) | RESPIRATORY_TRACT | 1 refills | Status: DC

## 2022-03-29 MED ORDER — VALSARTAN 320 MG PO TABS: 320.0000 mg | ORAL_TABLET | Freq: Every day | ORAL | 0 refills | Status: DC

## 2022-03-29 MED ORDER — ATENOLOL 25 MG PO TABS: 25.0000 mg | ORAL_TABLET | Freq: Every day | ORAL | 1 refills | Status: DC

## 2022-03-29 MED ORDER — METFORMIN HCL ER 500 MG PO TB24
500.0000 mg | ORAL_TABLET | Freq: Every day | ORAL | 1 refills | Status: DC
Start: 2022-03-29 — End: 2022-09-27

## 2022-03-29 MED ORDER — TADALAFIL 5 MG PO TABS: 5.0000 mg | ORAL_TABLET | Freq: Every day | ORAL | 1 refills | Status: DC

## 2022-03-29 NOTE — Patient Instructions (Addendum)
Please contact insurance to find out if they pay for weight loss medications What type/name   You need shingrix vaccine - ask about coverage and if cheaper to get it at our office or at local pharmacy

## 2022-04-03 LAB — COMPLETE METABOLIC PANEL WITH GFR
AG Ratio: 1.6 (calc) (ref 1.0–2.5)
ALT: 48 U/L — ABNORMAL HIGH (ref 9–46)
AST: 28 U/L (ref 10–35)
Albumin: 4.2 g/dL (ref 3.6–5.1)
Alkaline phosphatase (APISO): 66 U/L (ref 35–144)
BUN: 14 mg/dL (ref 7–25)
CO2: 23 mmol/L (ref 20–32)
Calcium: 9.5 mg/dL (ref 8.6–10.3)
Chloride: 107 mmol/L (ref 98–110)
Creat: 1.08 mg/dL (ref 0.70–1.30)
Globulin: 2.6 g/dL (calc) (ref 1.9–3.7)
Glucose, Bld: 106 mg/dL — ABNORMAL HIGH (ref 65–99)
Potassium: 4.7 mmol/L (ref 3.5–5.3)
Sodium: 142 mmol/L (ref 135–146)
Total Bilirubin: 0.3 mg/dL (ref 0.2–1.2)
Total Protein: 6.8 g/dL (ref 6.1–8.1)
eGFR: 84 mL/min/{1.73_m2} (ref >=60)

## 2022-04-03 LAB — LIPID PANEL
Cholesterol: 174 mg/dL (ref <200)
HDL: 50 mg/dL (ref >=40)
LDL Cholesterol (Calc): 99 mg/dL (calc)
Non-HDL Cholesterol (Calc): 124 mg/dL (calc) (ref <130)
Total CHOL/HDL Ratio: 3.5 (calc) (ref <5.0)
Triglycerides: 152 mg/dL — ABNORMAL HIGH (ref <150)

## 2022-04-03 LAB — HEMOGLOBIN A1C
Hgb A1c MFr Bld: 5.8 % of total Hgb — ABNORMAL HIGH (ref <5.7)
Mean Plasma Glucose: 120 mg/dL
eAG (mmol/L): 6.6 mmol/L

## 2022-06-14 NOTE — Progress Notes (Deleted)
Name: Matthew Barrett   MRN: [id number]    DOB: Nov 14, 1970   Date:06/14/2022       Progress Note  Subjective  Chief Complaint  Annual Exam  HPI  Patient presents for annual CPE.  IPSS Questionnaire (AUA-7): Over the past month.   1)  How often have you had a sensation of not emptying your bladder completely after you finish urinating?  {Rating:19227}  2)  How often have you had to urinate again less than two hours after you finished urinating? {Rating:19227}  3)  How often have you found you stopped and started again several times when you urinated?  {Rating:19227}  4) How difficult have you found it to postpone urination?  {Rating:19227}  5) How often have you had a weak urinary stream?  {Rating:19227}  6) How often have you had to push or strain to begin urination?  {Rating:19227}  7) How many times did you most typically get up to urinate from the time you went to bed until the time you got up in the morning?  {Rating:19228}  Total score:  0-7 mildly symptomatic   8-19 moderately symptomatic   20-35 severely symptomatic     Diet: *** Exercise: *** Last Dental Exam: **** Last Eye Exam: ***  Depression: phq 9 is {gen pos [id number]}    03/29/2022    2:56 PM 09/22/2021    3:06 PM 06/09/2021    3:20 PM 03/24/2021    9:22 AM 12/22/2020   10:17 AM  Depression screen PHQ 2/9  Decreased Interest 0 0 0 0 0  Down, Depressed, Hopeless 0 0 0 0 0  PHQ - 2 Score 0 0 0 0 0  Altered sleeping 1 0 0 0 0  Tired, decreased energy 1 0 0 0 0  Change in appetite 0 0 0 0 0  Feeling bad or failure about yourself  0 0 0 0 0  Trouble concentrating 0 0 0 0 0  Moving slowly or fidgety/restless 0 0 0 0 0  Suicidal thoughts 0 0 0 0 0  PHQ-9 Score 2 0 0 0 0    Hypertension:  BP Readings from Last 3 Encounters:  03/29/22 124/70  09/22/21 126/72  07/15/21 130/88    Obesity: Wt Readings from Last 3 Encounters:  03/29/22 277 lb (125.6 kg)  09/22/21 281 lb 1.6 oz (127.5 kg)  07/15/21 280 lb  (127 kg)   BMI Readings from Last 3 Encounters:  03/29/22 43.38 kg/m  09/22/21 44.03 kg/m  07/15/21 43.85 kg/m     Lipids:  Lab Results  Component Value Date   CHOL 174 04/02/2022   CHOL 213 (H) 12/22/2020   CHOL 199 01/09/2020   Lab Results  Component Value Date   HDL 50 04/02/2022   HDL 51 12/22/2020   HDL 44 01/09/2020   Lab Results  Component Value Date   LDLCALC 99 04/02/2022   LDLCALC 125 (H) 12/22/2020   LDLCALC 126 (H) 01/09/2020   Lab Results  Component Value Date   TRIG 152 (H) 04/02/2022   TRIG 228 (H) 12/22/2020   TRIG 170 (H) 01/09/2020   Lab Results  Component Value Date   CHOLHDL 3.5 04/02/2022   CHOLHDL 4.2 12/22/2020   CHOLHDL 4.5 01/09/2020   No results found for: "LDLDIRECT" Glucose:  Glucose, Bld  Date Value Ref Range Status  04/02/2022 106 (H) 65 - 99 mg/dL Final    Comment:    .  Fasting reference interval . For someone without known diabetes, a glucose value between 100 and 125 mg/dL is consistent with prediabetes and should be confirmed with a follow-up test. .   07/15/2021 93 70 - 99 mg/dL Final    Comment:    Glucose reference range applies only to samples taken after fasting for at least 8 hours.  12/22/2020 86 65 - 99 mg/dL Final    Comment:    .            Fasting reference interval .     Flowsheet Row Office Visit from 06/09/2021 in [facility]  AUDIT-C Score 1       Married STD testing and prevention (HIV/chl/gon/syphilis): 11/30/18 Sexual history:  Hep C Screening: 11/30/18 Skin cancer: Discussed monitoring for atypical lesions Colorectal cancer: N/A Prostate cancer:   Lab Results  Component Value Date   PSA 0.3 12/09/2011     Lung cancer:  Low Dose CT Chest recommended if Age 46-80 years, 30 pack-year currently smoking OR have quit w/in 15years. Patient  no a candidate for screening   AAA: The USPSTF recommends one-time screening with ultrasonography in men ages  18 to 75 years who have ever smoked. Patient   no, a candidate for screening  ECG:  04/21/20  Vaccines:   HPV: N/A Tdap: N/A Shingrix: N/A Pneumonia: N/A Flu: up to date COVID-19: up to date  Advanced Care Planning: A voluntary discussion about advance care planning including the explanation and discussion of advance directives.  Discussed health care proxy and Living will, and the patient was able to identify a health care proxy as ***.  Patient does not have a living will and power of attorney of health care   Patient Active Problem List   Diagnosis Date Noted   Tachycardia 08/08/2017   Hypertriglyceridemia 05/28/2016   Hypertension 11/24/2015   Perennial allergic rhinitis with seasonal variation 12/10/2014   Asthma, moderate persistent 12/10/2014   Dyslipidemia 12/10/2014   Obesity, Class III, BMI 40-49.9 (morbid obesity) ([facility]) 12/10/2014    Past Surgical History:  Procedure Laterality Date   TONSILECTOMY/ADENOIDECTOMY WITH MYRINGOTOMY      Family History  Problem Relation Age of Onset   Hyperlipidemia Mother    Hypertension Mother    Hypothyroidism Mother    Heart block Mother    Benign prostatic hyperplasia Father    Prostatitis Father        Had prostate removed due to being enlarged   Hyperlipidemia Sister    Hypertension Sister    Asthma Sister    CAD Maternal Grandfather    Hypertension Maternal Grandfather    Psoriasis Maternal Grandfather    Dementia Maternal Grandfather    Congestive Heart Failure Paternal Grandfather    Diabetes Paternal Grandfather    Arthritis Paternal Grandfather    Kidney disease Paternal Grandfather    Diabetes Maternal Grandmother    Arthritis Maternal Grandmother    Diabetes Paternal Grandmother    Arthritis Paternal Grandmother    Hypertension Sister    Hyperlipidemia Sister    Asthma Sister     Social History   Socioeconomic History   Marital status: Married    Spouse name: Rinaldo Cloud   Number of children: 0   Years of  education: Not on file   Highest education level: 12th grade  Occupational History   Not on file  Tobacco Use   Smoking status: Never   Smokeless tobacco: Former    Types: Sports administrator  Quit date: 11/29/2017  Vaping Use   Vaping Use: Never used  Substance and Sexual Activity   Alcohol use: No    Alcohol/week: 0.0 standard drinks of alcohol   Drug use: No   Sexual activity: Yes    Partners: Female  Other Topics Concern   Not on file  Social History Narrative   Married, no children   Social Determinants of Health   Financial Resource Strain: Low Risk  (06/09/2021)   Overall Financial Resource Strain (CARDIA)    Difficulty of Paying Living Expenses: Not hard at all  Food Insecurity: No Food Insecurity (06/09/2021)   Hunger Vital Sign    Worried About Running Out of Food in the Last Year: Never true    Ran Out of Food in the Last Year: Never true  Transportation Needs: No Transportation Needs (06/09/2021)   PRAPARE - Administrator, Civil Service (Medical): No    Lack of Transportation (Non-Medical): No  Physical Activity: Insufficiently Active (06/09/2021)   Exercise Vital Sign    Days of Exercise per Week: 3 days    Minutes of Exercise per Session: 30 min  Stress: No Stress Concern Present (06/09/2021)   [employer] of Occupational Health - Occupational Stress Questionnaire    Feeling of Stress : Not at all  Social Connections: Moderately Integrated (06/09/2021)   Social Connection and Isolation Panel [NHANES]    Frequency of Communication with Friends and Family: More than three times a week    Frequency of Social Gatherings with Friends and Family: Once a week    Attends Religious Services: More than 4 times per year    Active Member of [employer] or Organizations: No    Attends Banker Meetings: Never    Marital Status: Married  Catering manager Violence: Not At Risk (06/09/2021)   Humiliation, Afraid, Rape, and Kick questionnaire    Fear of Current or  Ex-Partner: No    Emotionally Abused: No    Physically Abused: No    Sexually Abused: No     Current Outpatient Medications:    albuterol (VENTOLIN HFA) 108 (90 Base) MCG/ACT inhaler, INHALE 2 PUFFS BY MOUTH EVERY 6 HOURS AS NEEDED FOR WHEEZING AND SHORTNESS OF BREATH, Disp: 8.5 each, Rfl: 0   Alpha Lipoic Acid 200 MG CAPS, Take by mouth daily., Disp: , Rfl:    atenolol (TENORMIN) 25 MG tablet, Take 1 tablet (25 mg total) by mouth daily., Disp: 90 tablet, Rfl: 1   azelastine (ASTELIN) 0.1 % nasal spray, Place 1 spray into both nostrils as needed., Disp: 30 mL, Rfl: 1   budesonide-formoterol (SYMBICORT) 160-4.5 MCG/ACT inhaler, Inhale 2 puffs into the lungs 2 (two) times daily., Disp: 30.6 each, Rfl: 1   cetirizine (ZYRTEC) 10 MG tablet, Take 10 mg by mouth daily., Disp: , Rfl:    Cholecalciferol (VITAMIN D) 50 MCG (2000 UT) CAPS, Take 1 capsule by mouth daily., Disp: , Rfl:    Cyanocobalamin (VITAMIN B 12 PO), Take 1,000 mg by mouth daily., Disp: , Rfl:    fluticasone (FLONASE) 50 MCG/ACT nasal spray, Place 2 sprays into both nostrils as needed for allergies., Disp: 16 g, Rfl: 1   metFORMIN (GLUCOPHAGE XR) 500 MG 24 hr tablet, Take 1 tablet (500 mg total) by mouth daily with breakfast., Disp: 90 tablet, Rfl: 1   montelukast (SINGULAIR) 10 MG tablet, TAKE 1 TABLET BY MOUTH EVERY DAY, Disp: 90 tablet, Rfl: 1   Semaglutide (RYBELSUS) 14 MG TABS, Take 1  tablet (14 mg total) by mouth daily., Disp: 90 tablet, Rfl: 1   tadalafil (CIALIS) 5 MG tablet, Take 1 tablet (5 mg total) by mouth daily., Disp: 90 tablet, Rfl: 1   valsartan (DIOVAN) 320 MG tablet, Take 1 tablet (320 mg total) by mouth daily. In place of Olmasartan, Disp: 90 tablet, Rfl: 0  Allergies  Allergen Reactions   Aspirin    Tetanus Toxoids Other (See Comments)    Flared up Asthma     ROS  ***   Objective  There were no vitals filed for this visit.  There is no height or weight on file to calculate BMI.  Physical  Exam ***  Recent Results (from the past 2160 hour(s))  Lipid panel     Status: Abnormal   Collection Time: 04/02/22  8:07 AM  Result Value Ref Range   Cholesterol 174 <200 mg/dL   HDL 50 > OR = 40 mg/dL   Triglycerides 161 (H) <150 mg/dL   LDL Cholesterol (Calc) 99 mg/dL (calc)    Comment: Reference range: <100 . Desirable range <100 mg/dL for primary prevention;   <70 mg/dL for patients with CHD or diabetic patients  with > or = 2 CHD risk factors. Marland Kitchen LDL-C is now calculated using the Martin-Hopkins  calculation, which is a validated novel method providing  better accuracy than the Friedewald equation in the  estimation of LDL-C.  Horald Pollen et al. Lenox Ahr. 0960;454(09): 2061-2068  (http://education.[facility].com/faq/FAQ164)    Total CHOL/HDL Ratio 3.5 <5.0 (calc)   Non-HDL Cholesterol (Calc) 124 <130 mg/dL (calc)    Comment: For patients with diabetes plus 1 major ASCVD risk  factor, treating to a non-HDL-C goal of <100 mg/dL  (LDL-C of <81 mg/dL) is considered a therapeutic  option.   Hemoglobin A1c     Status: Abnormal   Collection Time: 04/02/22  8:07 AM  Result Value Ref Range   Hgb A1c MFr Bld 5.8 (H) <5.7 % of total Hgb    Comment: For someone without known diabetes, a hemoglobin  A1c value between 5.7% and 6.4% is consistent with prediabetes and should be confirmed with a  follow-up test. . For someone with known diabetes, a value <7% indicates that their diabetes is well controlled. A1c targets should be individualized based on duration of diabetes, age, comorbid conditions, and other considerations. . This assay result is consistent with an increased risk of diabetes. . Currently, no consensus exists regarding use of hemoglobin A1c for diagnosis of diabetes for children. .    Mean Plasma Glucose 120 mg/dL   eAG (mmol/L) 6.6 mmol/L    Comment: . HbA1c performed on Roche platform. Effective 11/23/21 a change in test platforms may have  shifted HbA1c  results compared to historical results.   COMPLETE METABOLIC PANEL WITH GFR     Status: Abnormal   Collection Time: 04/02/22  8:07 AM  Result Value Ref Range   Glucose, Bld 106 (H) 65 - 99 mg/dL    Comment: .            Fasting reference interval . For someone without known diabetes, a glucose value between 100 and 125 mg/dL is consistent with prediabetes and should be confirmed with a follow-up test. .    BUN 14 7 - 25 mg/dL   Creat 1.91 4.78 - 2.95 mg/dL   eGFR 84 > OR = 60 AO/ZHY/[id number]   BUN/Creatinine Ratio SEE NOTE: 6 - 22 (calc)    Comment:    Not Reported:  BUN and Creatinine are within    reference range. .    Sodium 142 135 - 146 mmol/L   Potassium 4.7 3.5 - 5.3 mmol/L   Chloride 107 98 - 110 mmol/L   CO2 23 20 - 32 mmol/L   Calcium 9.5 8.6 - 10.3 mg/dL   Total Protein 6.8 6.1 - 8.1 g/dL   Albumin 4.2 3.6 - 5.1 g/dL   Globulin 2.6 1.9 - 3.7 g/dL (calc)   AG Ratio 1.6 1.0 - 2.5 (calc)   Total Bilirubin 0.3 0.2 - 1.2 mg/dL   Alkaline phosphatase (APISO) 66 35 - 144 U/L   AST 28 10 - 35 U/L   ALT 48 (H) 9 - 46 U/L     Fall Risk:    03/29/2022    2:56 PM 09/22/2021    2:59 PM 06/09/2021    3:20 PM 03/24/2021    9:22 AM 12/22/2020   10:17 AM  Fall Risk   Falls in the past year? 0 0 0 0 0  Number falls in past yr: 0  0 0 0  Injury with Fall? 0  0 0 0  Risk for fall due to : No Fall Risks No Fall Risks No Fall Risks No Fall Risks No Fall Risks  Follow up Falls prevention discussed Falls prevention discussed Falls prevention discussed Falls prevention discussed Falls prevention discussed     Functional Status Survey:      Assessment & Plan  1. Well adult exam ***    -Prostate cancer screening and PSA options (with potential risks and benefits of testing vs not testing) were discussed along with recent recs/guidelines. -USPSTF grade A and B recommendations reviewed with patient; age-appropriate recommendations, preventive care, screening tests, etc  discussed and encouraged; healthy living encouraged; see AVS for patient education given to patient -Discussed importance of 150 minutes of physical activity weekly, eat two servings of fish weekly, eat one serving of tree nuts ( cashews, pistachios, pecans, almonds.Marland Kitchen) every other day, eat 6 servings of fruit/vegetables daily and drink plenty of water and avoid sweet beverages.  -Reviewed Health Maintenance: yes

## 2022-06-15 ENCOUNTER — Encounter: Payer: 59 | Admitting: Family Medicine

## 2022-06-15 DIAGNOSIS — Z0000 Encounter for general adult medical examination without abnormal findings: Secondary | ICD-10-CM

## 2022-07-26 ENCOUNTER — Other Ambulatory Visit: Payer: Self-pay | Admitting: Family Medicine

## 2022-07-26 DIAGNOSIS — J302 Other seasonal allergic rhinitis: Secondary | ICD-10-CM

## 2022-07-26 DIAGNOSIS — J4540 Moderate persistent asthma, uncomplicated: Secondary | ICD-10-CM

## 2022-08-04 IMAGING — CT CT ORBITS W/ CM
3 of 6 series · 12 of 47 positions shown, 14 images · IV contrast (APPLIED)
Comparison: None Available.

CLINICAL DATA: Orbital cellulitis suspected

EXAM:
CT ORBITS WITH CONTRAST
TECHNIQUE: Multidetector CT images was performed according to the standard
protocol following intravenous contrast administration.

[Series 2: orbits 2.0 hr40 3 st · axial · 0.40mm/px · z∈[-134,-52]mm · 7 of 49 slices shown, 9 images]
[im 4/49  brain]
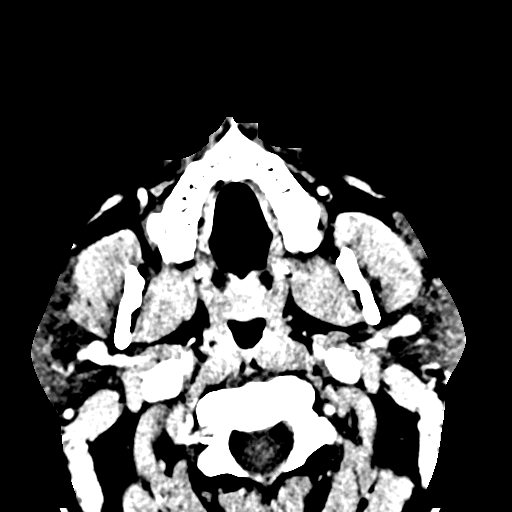
[im 4/49  bone]
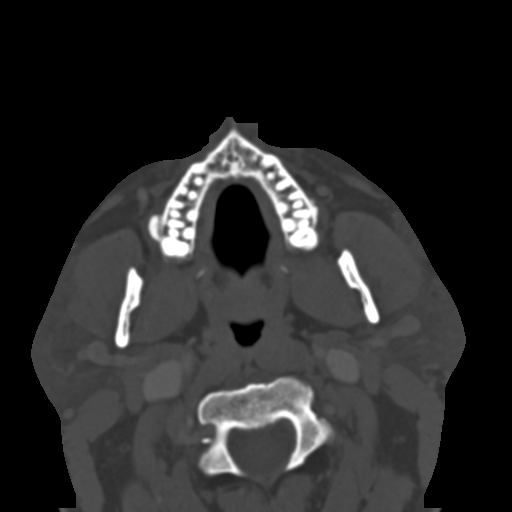
[im 11/49  bone]
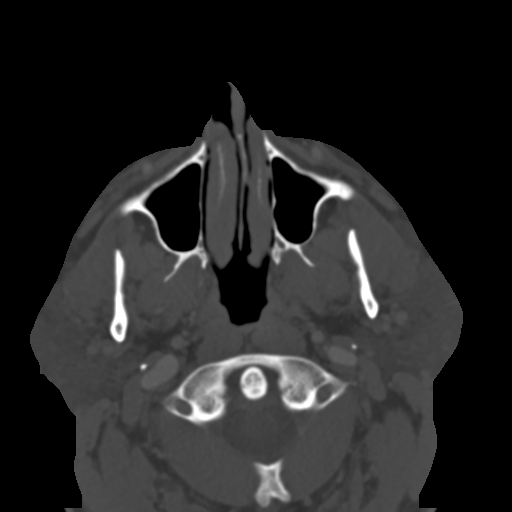
[im 18/49  bone]
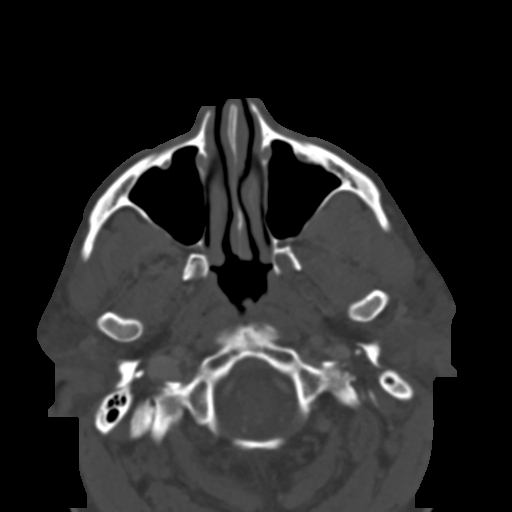
[im 25/49  bone]
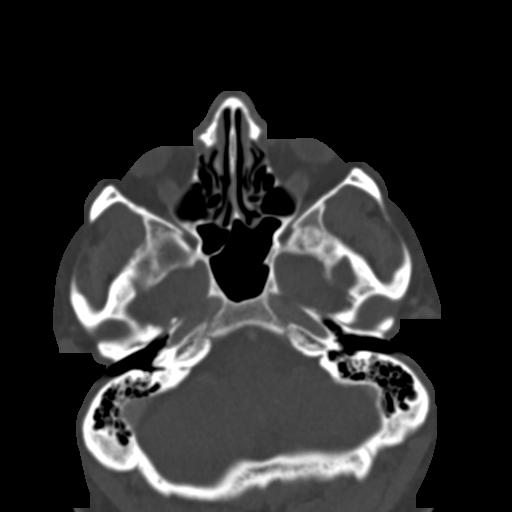
[im 31/49  brain]
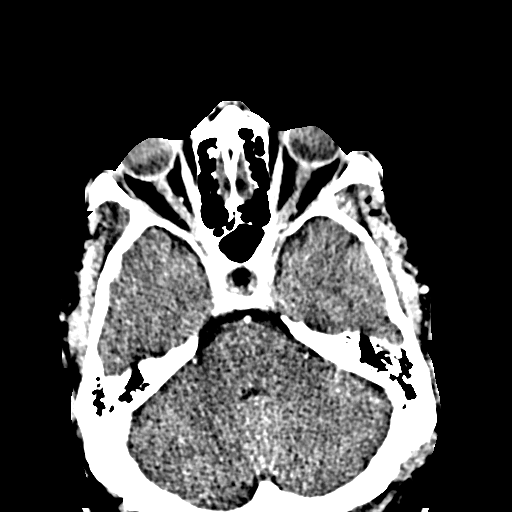
[im 31/49  bone]
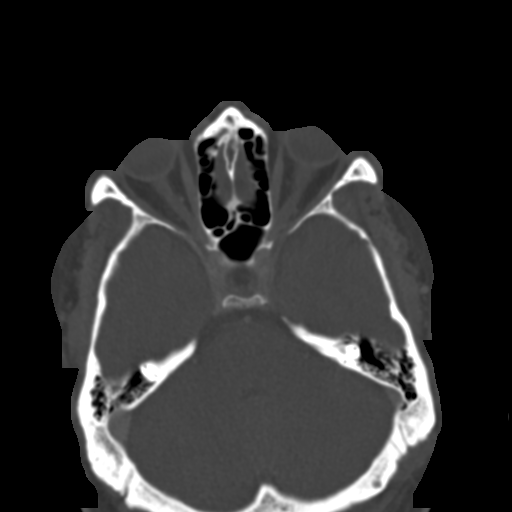
[im 38/49  bone]
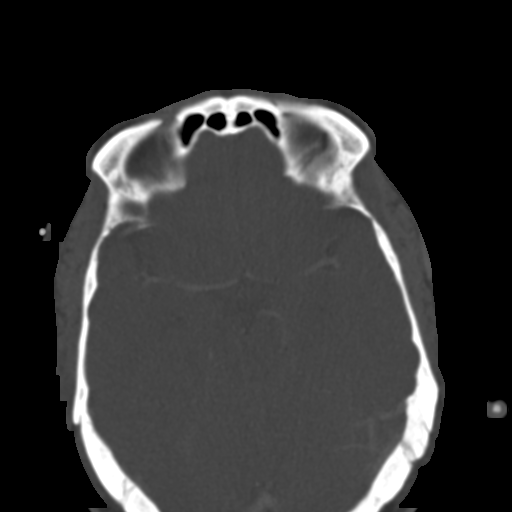
[im 45/49  bone]
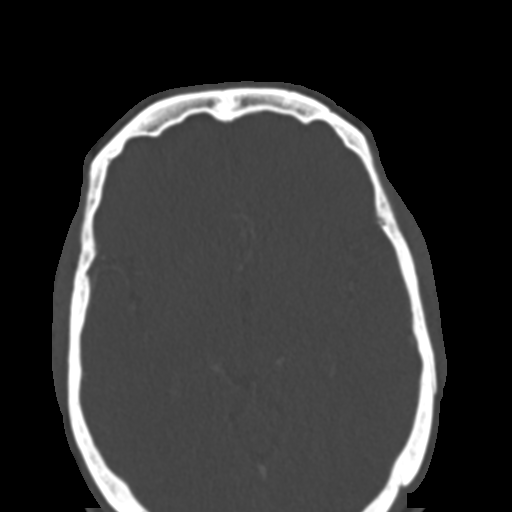

[Series 4: st cor · coronal · 0.20mm/px · 3 of 112 slices shown]
[im 28/112  bone]
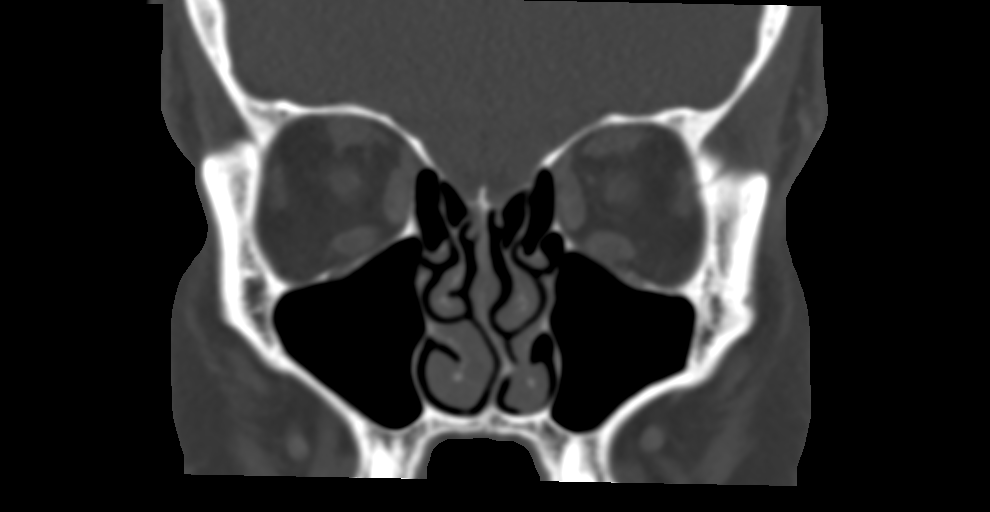
[im 56/112  bone]
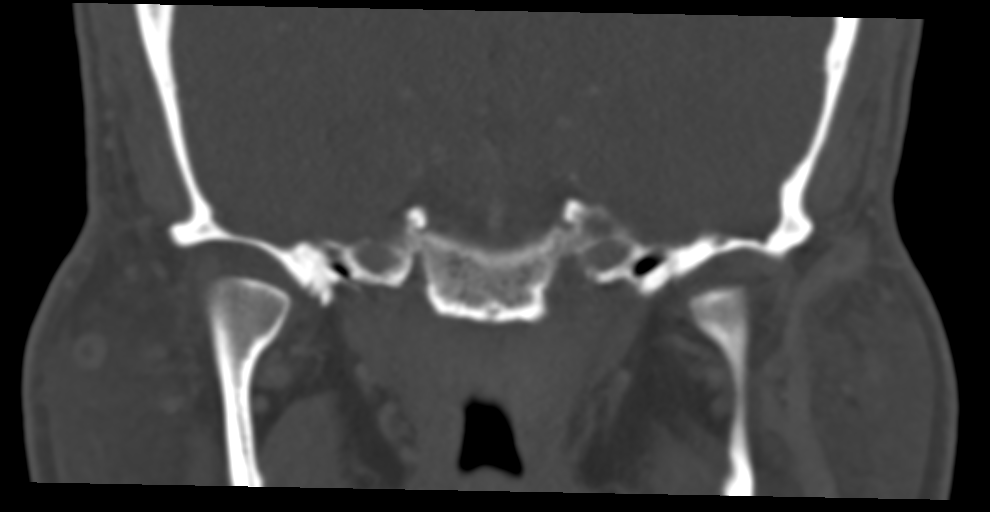
[im 84/112  bone]
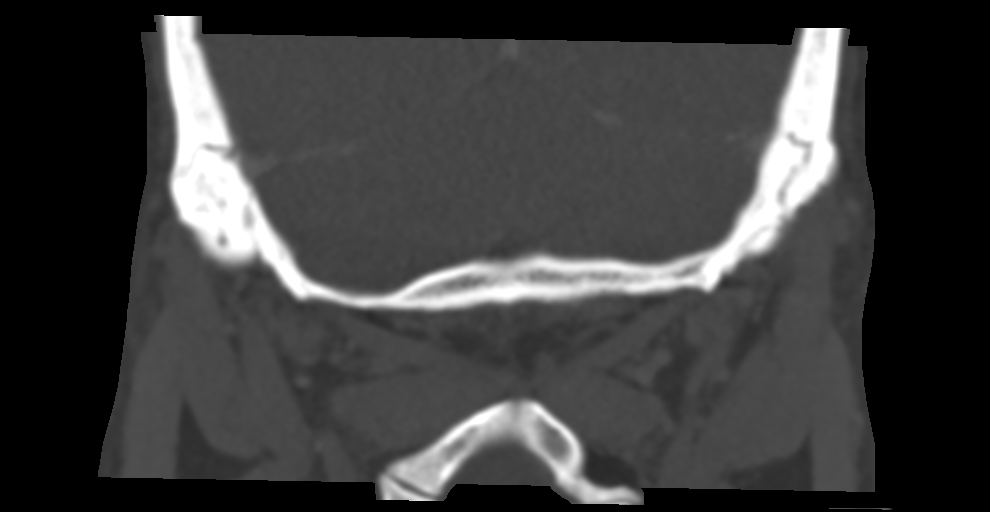

[Series 7: bone sag · sagittal · 0.20mm/px · 2 of 101 slices shown]
[im 34/101  bone]
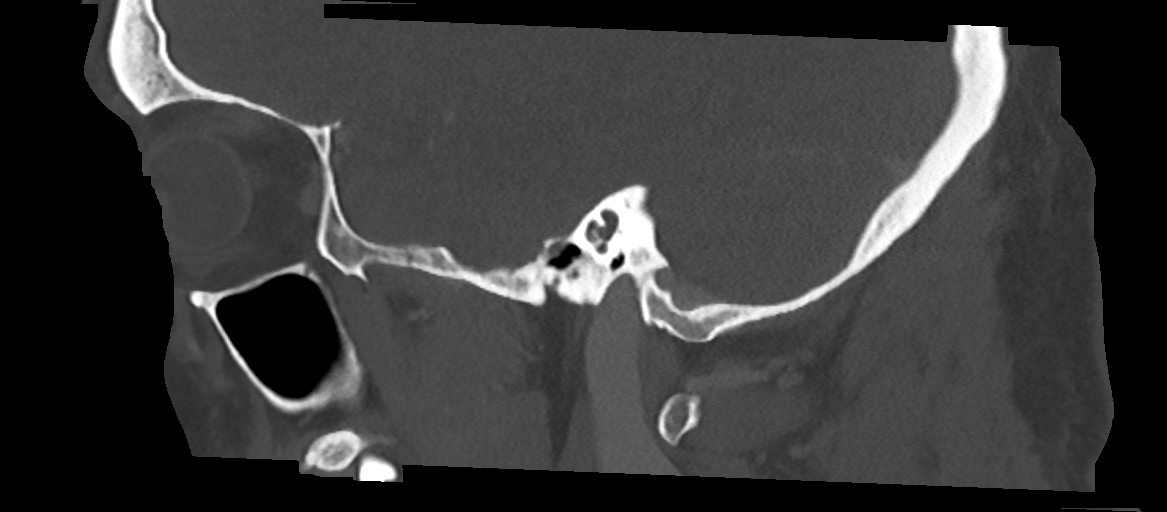
[im 67/101  bone]
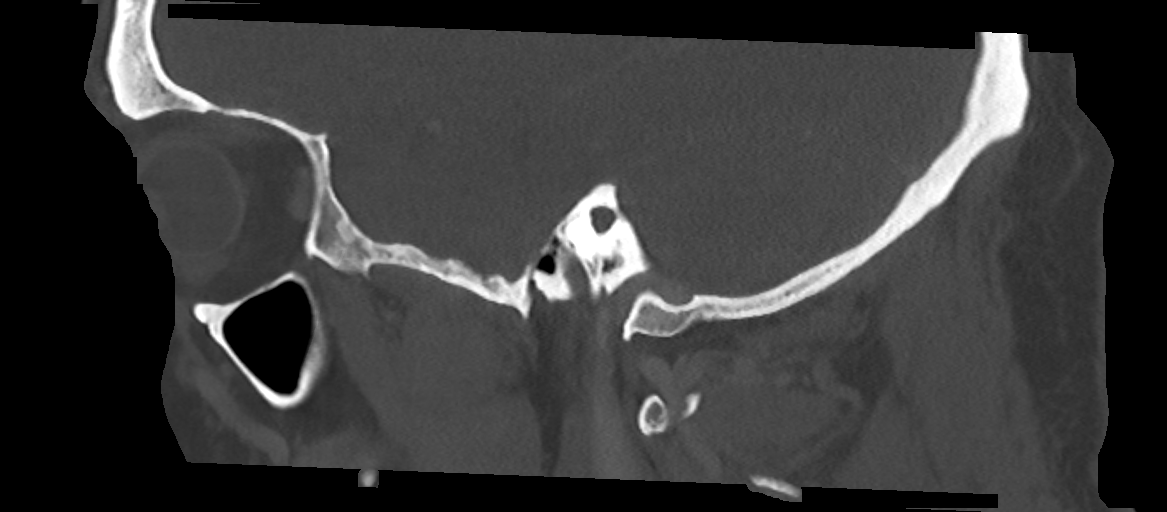

[12 of 47 positions shown; findings below may reference images not displayed]

RADIATION DOSE REDUCTION: This exam was performed according to the
departmental dose-optimization program which includes automated
exposure control, adjustment of the mA and/or kV according to
patient size and/or use of iterative reconstruction technique.

CONTRAST:  100mL OMNIPAQUE IOHEXOL 300 MG/ML  SOLN
FINDINGS: Orbits:  No evidence of postseptal inflammatory changes.

Visible paranasal sinuses: Minor mucosal thickening.

Soft tissues: No soft tissue abscess.

Osseous: Unremarkable.

Limited intracranial: No abnormal enhancement.
IMPRESSION: No evidence of abscess or intraorbital extension.

## 2022-08-25 ENCOUNTER — Other Ambulatory Visit: Payer: Self-pay | Admitting: Family Medicine

## 2022-08-25 DIAGNOSIS — E8881 Metabolic syndrome: Secondary | ICD-10-CM

## 2022-08-25 NOTE — Telephone Encounter (Unsigned)
Copied from CRM [id number]. Topic: General - Other >> Aug 25, 2022 11:21 AM Everette C wrote: Reason for CRM: Medication Refill - Medication: metFORMIN (GLUCOPHAGE XR) 500 MG 24 hr tablet [[id number]]  Has the patient contacted their pharmacy? Yes.   (Agent: If no, request that the patient contact the pharmacy for the refill. If patient does not wish to contact the pharmacy document the reason why and proceed with request.) (Agent: If yes, when and what did the pharmacy advise?)  Preferred Pharmacy (with phone number or street name): [facility] [address] [place] - [address] [place] [address] Phone: [phone] Fax: [phone] Hours: Not open 24 hours   Has the patient been seen for an appointment in the last year OR does the patient have an upcoming appointment? Yes.    Agent: Please be advised that RX refills may take up to 3 business days. We ask that you follow-up with your pharmacy.

## 2022-08-26 NOTE — Telephone Encounter (Signed)
Requested Prescriptions  Refused Prescriptions Disp Refills   metFORMIN (GLUCOPHAGE XR) 500 MG 24 hr tablet 90 tablet 1    Sig: Take 1 tablet (500 mg total) by mouth daily with breakfast.     Endocrinology:  Diabetes - Biguanides Failed - 08/25/2022 11:47 AM      Failed - B12 Level in normal range and within 720 days    No results found for: "VITAMINB12"       Failed - CBC within normal limits and completed in the last 12 months    WBC  Date Value Ref Range Status  07/15/2021 6.7 4.0 - 10.5 K/uL Final   RBC  Date Value Ref Range Status  07/15/2021 4.96 4.22 - 5.81 MIL/uL Final   Hemoglobin  Date Value Ref Range Status  07/15/2021 14.7 13.0 - 17.0 g/dL Final   HCT  Date Value Ref Range Status  07/15/2021 46.4 39.0 - 52.0 % Final   MCHC  Date Value Ref Range Status  07/15/2021 31.7 30.0 - 36.0 g/dL Final   [facility]  Date Value Ref Range Status  07/15/2021 29.6 26.0 - 34.0 pg Final   MCV  Date Value Ref Range Status  07/15/2021 93.5 80.0 - 100.0 fL Final   No results found for: "PLTCOUNTKUC", "LABPLAT", "POCPLA" RDW  Date Value Ref Range Status  07/15/2021 12.8 11.5 - 15.5 % Final         Passed - Cr in normal range and within 360 days    Creat  Date Value Ref Range Status  04/02/2022 1.08 0.70 - 1.30 mg/dL Final         Passed - HBA1C is between 0 and 7.9 and within 180 days    Hgb A1c MFr Bld  Date Value Ref Range Status  04/02/2022 5.8 (H) <5.7 % of total Hgb Final    Comment:    For someone without known diabetes, a hemoglobin  A1c value between 5.7% and 6.4% is consistent with prediabetes and should be confirmed with a  follow-up test. . For someone with known diabetes, a value <7% indicates that their diabetes is well controlled. A1c targets should be individualized based on duration of diabetes, age, comorbid conditions, and other considerations. . This assay result is consistent with an increased risk of diabetes. . Currently, no consensus exists  regarding use of hemoglobin A1c for diagnosis of diabetes for children. .          Passed - eGFR in normal range and within 360 days    GFR, Est African American  Date Value Ref Range Status  01/09/2020 83 > OR = 60 mL/min/1.15m2 Final   GFR, Est Non African American  Date Value Ref Range Status  01/09/2020 72 > OR = 60 mL/min/1.41m2 Final   GFR, Estimated  Date Value Ref Range Status  07/15/2021 >60 >60 mL/min Final    Comment:    (NOTE) Calculated using the CKD-EPI Creatinine Equation (2021)    eGFR  Date Value Ref Range Status  04/02/2022 84 > OR = 60 mL/min/1.100m2 Final         Passed - Valid encounter within last 6 months    Recent Outpatient Visits           5 months ago Obesity, Class III, BMI 40-49.9 (morbid obesity) [facility])   [facility] Alba Cory, MD   11 months ago Essential hypertension   [facility] Alba Cory, MD   1 year ago Well  adult exam   [facility] Alba Cory, MD   1 year ago Moderate persistent asthma without complication   [facility] Alba Cory, MD   1 year ago Moderate persistent asthma without complication   [facility] Alba Cory, MD       Future Appointments             In 1 month Carlynn Purl, Danna Hefty, MD [facility]

## 2022-09-24 NOTE — Progress Notes (Unsigned)
Name: Matthew Barrett   MRN: [id number]    DOB: [birth year]   Date:09/24/2022       Progress Note  Subjective  Chief Complaint  Follow Up  HPI  Asthma: Currently using Symbicort BID and states asthma has been controlled , no wheezing, cough or SOB. Continue current regiment  He asked for neb machine    HTN:  BP is at goal, no chest pain, palpitation or SOB. Heart rate controlled with Atenolol . Unchanged   Hyperlipidemia:   The 10-year ASCVD risk score (Arnett DK, et al., 2019) is: 4.1%   Values used to calculate the score:     Age: 52 years     Sex: Male     Is Non-Hispanic African American: No     Diabetic: No     Tobacco smoker: No     Systolic Blood Pressure: 124 mmHg     Is BP treated: Yes     HDL Cholesterol: 51 mg/dL     Total Cholesterol: 213 mg/dL    Morbid Obesity: he took Ozempic but not covered by insurance. He is now on Rybelsus 14 , started Feb 2022 - weight was 279 lbs, last Fall it went up to 295 lbs, down to 281.1 lbs today is is 277 lbs. Discussed Wegovy and Zepbound - he will contact his insurance to find out about coverage   Metabolic syndrome: history of hypertension, elevated triglycerides, increase abomnal girth. He is taking Rybelsus now at 14 mg daily and also on Metformin , Last A1C was 5.7 %,  we will recheck labs this week   Perennial Allergic Rhinitis he takes medication every night, xyzal and seems to control symptoms    Low Testosterone and ED : under the care of Dr. Trish Mage, unable to afford Clomiphene but he was  taking Cialis 10 mg every other day   instead of Viagra and seems to work better for him. We will try sending 5 mg dose but he can call me if 10 mg every other day works best   Patient Active Problem List   Diagnosis Date Noted   Tachycardia 08/08/2017   Hypertriglyceridemia 05/28/2016   Hypertension 11/24/2015   Perennial allergic rhinitis with seasonal variation 12/10/2014   Asthma, moderate persistent 12/10/2014   Dyslipidemia  12/10/2014   Obesity, Class III, BMI 40-49.9 (morbid obesity) (HCC) 12/10/2014    Past Surgical History:  Procedure Laterality Date   TONSILECTOMY/ADENOIDECTOMY WITH MYRINGOTOMY      Family History  Problem Relation Age of Onset   Hyperlipidemia Mother    Hypertension Mother    Hypothyroidism Mother    Heart block Mother    Benign prostatic hyperplasia Father    Prostatitis Father        Had prostate removed due to being enlarged   Hyperlipidemia Sister    Hypertension Sister    Asthma Sister    CAD Maternal Grandfather    Hypertension Maternal Grandfather    Psoriasis Maternal Grandfather    Dementia Maternal Grandfather    Congestive Heart Failure Paternal Grandfather    Diabetes Paternal Grandfather    Arthritis Paternal Grandfather    Kidney disease Paternal Grandfather    Diabetes Maternal Grandmother    Arthritis Maternal Grandmother    Diabetes Paternal Grandmother    Arthritis Paternal Grandmother    Hypertension Sister    Hyperlipidemia Sister    Asthma Sister     Social History   Tobacco Use   Smoking status: Never   Smokeless  tobacco: Former    Types: Chew    Quit date: 11/29/2017  Substance Use Topics   Alcohol use: No    Alcohol/week: 0.0 standard drinks of alcohol     Current Outpatient Medications:    albuterol (VENTOLIN HFA) 108 (90 Base) MCG/ACT inhaler, INHALE 2 PUFFS BY MOUTH EVERY 6 HOURS AS NEEDED FOR WHEEZING AND SHORTNESS OF BREATH, Disp: 8.5 each, Rfl: 0   Alpha Lipoic Acid 200 MG CAPS, Take by mouth daily., Disp: , Rfl:    atenolol (TENORMIN) 25 MG tablet, Take 1 tablet (25 mg total) by mouth daily., Disp: 90 tablet, Rfl: 1   azelastine (ASTELIN) 0.1 % nasal spray, Place 1 spray into both nostrils as needed., Disp: 30 mL, Rfl: 1   budesonide-formoterol (SYMBICORT) 160-4.5 MCG/ACT inhaler, Inhale 2 puffs into the lungs 2 (two) times daily., Disp: 30.6 each, Rfl: 1   cetirizine (ZYRTEC) 10 MG tablet, Take 10 mg by mouth daily., Disp: ,  Rfl:    Cholecalciferol (VITAMIN D) 50 MCG (2000 UT) CAPS, Take 1 capsule by mouth daily., Disp: , Rfl:    Cyanocobalamin (VITAMIN B 12 PO), Take 1,000 mg by mouth daily., Disp: , Rfl:    fluticasone (FLONASE) 50 MCG/ACT nasal spray, Place 2 sprays into both nostrils as needed for allergies., Disp: 16 g, Rfl: 1   metFORMIN (GLUCOPHAGE XR) 500 MG 24 hr tablet, Take 1 tablet (500 mg total) by mouth daily with breakfast., Disp: 90 tablet, Rfl: 1   montelukast (SINGULAIR) 10 MG tablet, TAKE 1 TABLET BY MOUTH EVERY DAY, Disp: 90 tablet, Rfl: 0   Semaglutide (RYBELSUS) 14 MG TABS, Take 1 tablet (14 mg total) by mouth daily., Disp: 90 tablet, Rfl: 1   tadalafil (CIALIS) 5 MG tablet, Take 1 tablet (5 mg total) by mouth daily., Disp: 90 tablet, Rfl: 1   valsartan (DIOVAN) 320 MG tablet, Take 1 tablet (320 mg total) by mouth daily. In place of Olmasartan, Disp: 90 tablet, Rfl: 0  Allergies  Allergen Reactions   Aspirin    Tetanus Toxoids Other (See Comments)    Flared up Asthma    I personally reviewed active problem list, medication list, allergies, family history, social history, health maintenance with the patient/caregiver today.   ROS  ***  Objective  There were no vitals filed for this visit.  There is no height or weight on file to calculate BMI.  Physical Exam ***  No results found for this or any previous visit (from the past 2160 hour(s)).   PHQ2/9:    03/29/2022    2:56 PM 09/22/2021    3:06 PM 06/09/2021    3:20 PM 03/24/2021    9:22 AM 12/22/2020   10:17 AM  Depression screen PHQ 2/9  Decreased Interest 0 0 0 0 0  Down, Depressed, Hopeless 0 0 0 0 0  PHQ - 2 Score 0 0 0 0 0  Altered sleeping 1 0 0 0 0  Tired, decreased energy 1 0 0 0 0  Change in appetite 0 0 0 0 0  Feeling bad or failure about yourself  0 0 0 0 0  Trouble concentrating 0 0 0 0 0  Moving slowly or fidgety/restless 0 0 0 0 0  Suicidal thoughts 0 0 0 0 0  PHQ-9 Score 2 0 0 0 0    phq 9 is {gen pos  [id number]}   Fall Risk:    03/29/2022    2:56 PM 09/22/2021    2:59 PM  06/09/2021    3:20 PM 03/24/2021    9:22 AM 12/22/2020   10:17 AM  Fall Risk   Falls in the past year? 0 0 0 0 0  Number falls in past yr: 0  0 0 0  Injury with Fall? 0  0 0 0  Risk for fall due to : No Fall Risks No Fall Risks No Fall Risks No Fall Risks No Fall Risks  Follow up Falls prevention discussed Falls prevention discussed Falls prevention discussed Falls prevention discussed Falls prevention discussed      Functional Status Survey:      Assessment & Plan  *** There are no diagnoses linked to this encounter.

## 2022-09-27 ENCOUNTER — Ambulatory Visit: Payer: 59 | Admitting: Family Medicine

## 2022-09-27 ENCOUNTER — Encounter: Payer: Self-pay | Admitting: Family Medicine

## 2022-09-27 VITALS — BP 126/72 | HR 91 | Resp 16 | Ht 66.0 in | Wt 281.0 lb

## 2022-09-27 DIAGNOSIS — U099 Post covid-19 condition, unspecified: Secondary | ICD-10-CM

## 2022-09-27 DIAGNOSIS — E785 Hyperlipidemia, unspecified: Secondary | ICD-10-CM | POA: Diagnosis not present

## 2022-09-27 DIAGNOSIS — R7989 Other specified abnormal findings of blood chemistry: Secondary | ICD-10-CM

## 2022-09-27 DIAGNOSIS — R000 Tachycardia, unspecified: Secondary | ICD-10-CM

## 2022-09-27 DIAGNOSIS — J4540 Moderate persistent asthma, uncomplicated: Secondary | ICD-10-CM

## 2022-09-27 DIAGNOSIS — I10 Essential (primary) hypertension: Secondary | ICD-10-CM

## 2022-09-27 DIAGNOSIS — E8881 Metabolic syndrome: Secondary | ICD-10-CM | POA: Diagnosis not present

## 2022-09-27 DIAGNOSIS — J302 Other seasonal allergic rhinitis: Secondary | ICD-10-CM

## 2022-09-27 DIAGNOSIS — R5383 Other fatigue: Secondary | ICD-10-CM

## 2022-09-27 DIAGNOSIS — N529 Male erectile dysfunction, unspecified: Secondary | ICD-10-CM

## 2022-09-27 DIAGNOSIS — R519 Headache, unspecified: Secondary | ICD-10-CM

## 2022-09-27 DIAGNOSIS — J3089 Other allergic rhinitis: Secondary | ICD-10-CM

## 2022-09-27 MED ORDER — ALBUTEROL SULFATE HFA 108 (90 BASE) MCG/ACT IN AERS: INHALATION_SPRAY | RESPIRATORY_TRACT | 0 refills | Status: DC

## 2022-09-27 MED ORDER — RYBELSUS 14 MG PO TABS: 1.0000 | ORAL_TABLET | Freq: Every day | ORAL | 2 refills | Status: DC

## 2022-09-27 MED ORDER — MONTELUKAST SODIUM 10 MG PO TABS: 10.0000 mg | ORAL_TABLET | Freq: Every day | ORAL | 2 refills | Status: DC

## 2022-09-27 MED ORDER — VALSARTAN 320 MG PO TABS: 320.0000 mg | ORAL_TABLET | Freq: Every day | ORAL | 2 refills | Status: DC

## 2022-09-27 MED ORDER — AZELASTINE HCL 0.1 % NA SOLN: 1.0000 | NASAL | 1 refills | Status: AC | PRN

## 2022-09-27 MED ORDER — BUDESONIDE-FORMOTEROL FUMARATE 160-4.5 MCG/ACT IN AERO: 2.0000 | INHALATION_SPRAY | Freq: Two times a day (BID) | RESPIRATORY_TRACT | 2 refills | Status: DC

## 2022-09-27 MED ORDER — FLUTICASONE PROPIONATE 50 MCG/ACT NA SUSP: 2.0000 | NASAL | 1 refills | Status: AC | PRN

## 2022-09-27 MED ORDER — BUPROPION HCL ER (XL) 150 MG PO TB24: 150.0000 mg | ORAL_TABLET | Freq: Every day | ORAL | 2 refills | Status: DC

## 2022-09-27 MED ORDER — ATENOLOL 25 MG PO TABS: 25.0000 mg | ORAL_TABLET | Freq: Every day | ORAL | 2 refills | Status: DC

## 2022-09-27 MED ORDER — TADALAFIL 5 MG PO TABS: 5.0000 mg | ORAL_TABLET | Freq: Every day | ORAL | 2 refills | Status: DC

## 2022-09-27 MED ORDER — METFORMIN HCL ER 500 MG PO TB24
500.0000 mg | ORAL_TABLET | Freq: Every day | ORAL | 2 refills | Status: DC
Start: 2022-09-27 — End: 2023-04-27

## 2022-10-31 ENCOUNTER — Other Ambulatory Visit: Payer: Self-pay | Admitting: Family Medicine

## 2022-10-31 DIAGNOSIS — J302 Other seasonal allergic rhinitis: Secondary | ICD-10-CM

## 2022-10-31 DIAGNOSIS — J4540 Moderate persistent asthma, uncomplicated: Secondary | ICD-10-CM

## 2022-11-02 ENCOUNTER — Ambulatory Visit (INDEPENDENT_AMBULATORY_CARE_PROVIDER_SITE_OTHER): Payer: 59

## 2022-11-02 DIAGNOSIS — Z23 Encounter for immunization: Secondary | ICD-10-CM | POA: Diagnosis not present

## 2023-04-27 ENCOUNTER — Ambulatory Visit: Payer: 59 | Admitting: Family Medicine

## 2023-04-27 ENCOUNTER — Encounter: Payer: Self-pay | Admitting: Family Medicine

## 2023-04-27 VITALS — BP 126/80 | HR 97 | Resp 16 | Ht 66.0 in | Wt 274.1 lb

## 2023-04-27 DIAGNOSIS — J302 Other seasonal allergic rhinitis: Secondary | ICD-10-CM

## 2023-04-27 DIAGNOSIS — I10 Essential (primary) hypertension: Secondary | ICD-10-CM

## 2023-04-27 DIAGNOSIS — R7989 Other specified abnormal findings of blood chemistry: Secondary | ICD-10-CM

## 2023-04-27 DIAGNOSIS — E785 Hyperlipidemia, unspecified: Secondary | ICD-10-CM

## 2023-04-27 DIAGNOSIS — J3089 Other allergic rhinitis: Secondary | ICD-10-CM

## 2023-04-27 DIAGNOSIS — R5383 Other fatigue: Secondary | ICD-10-CM

## 2023-04-27 DIAGNOSIS — E8881 Metabolic syndrome: Secondary | ICD-10-CM

## 2023-04-27 DIAGNOSIS — N529 Male erectile dysfunction, unspecified: Secondary | ICD-10-CM

## 2023-04-27 DIAGNOSIS — J4540 Moderate persistent asthma, uncomplicated: Secondary | ICD-10-CM

## 2023-04-27 MED ORDER — MONTELUKAST SODIUM 10 MG PO TABS: 10.0000 mg | ORAL_TABLET | Freq: Every day | ORAL | 1 refills | Status: DC

## 2023-04-27 MED ORDER — TADALAFIL 5 MG PO TABS: 5.0000 mg | ORAL_TABLET | Freq: Every day | ORAL | 1 refills | Status: DC

## 2023-04-27 MED ORDER — METFORMIN HCL ER 500 MG PO TB24
500.0000 mg | ORAL_TABLET | Freq: Every day | ORAL | 1 refills | Status: DC
Start: 2023-04-27 — End: 2023-10-31

## 2023-04-27 MED ORDER — BUDESONIDE-FORMOTEROL FUMARATE 160-4.5 MCG/ACT IN AERO: 2.0000 | INHALATION_SPRAY | Freq: Two times a day (BID) | RESPIRATORY_TRACT | 1 refills | Status: DC

## 2023-04-27 MED ORDER — VALSARTAN 320 MG PO TABS: 320.0000 mg | ORAL_TABLET | Freq: Every day | ORAL | 1 refills | Status: DC

## 2023-04-27 MED ORDER — BUPROPION HCL ER (XL) 150 MG PO TB24: 150.0000 mg | ORAL_TABLET | Freq: Every day | ORAL | 1 refills | Status: DC

## 2023-04-27 NOTE — Progress Notes (Signed)
 Name: Matthew Barrett   MRN: [id number]    DOB: 10-10-1970   Date:04/27/2023       Progress Note  Subjective  Chief Complaint  Chief Complaint  Patient presents with   Medical Management of Chronic Issues   HPI   Asthma: Currently using Symbicort BID and states asthma has been controlled , no wheezing, cough or SOB. He had flu and bronchitis    HTN:  BP is at goal, no chest pain, palpitation or SOB. He still has mild tachycardia but he stopped Atenolol because he did not feel well on medication . Continue current regiment  He denies chest pain    Hyperlipidemia:    The 10-year ASCVD risk score (Arnett DK, et al., 2019) is: 3.6%   Values used to calculate the score:     Age: 53 years     Sex: Male     Is Non-Hispanic African American: No     Diabetic: No     Tobacco smoker: No     Systolic Blood Pressure: 126 mmHg     Is BP treated: Yes     HDL Cholesterol: 50 mg/dL     Total Cholesterol: 174 mg/dL      Morbid Obesity: he took Ozempic but not covered by insurance. He was taking Rybelsus and was also keeping weight down, he state insurance no longer covering medication. He has changed his diet, replaced sodas with sparkling water, weight is down from 281 lbs in August to 274.1 lbs    Metabolic syndrome: history of hypertension, elevated triglycerides, increase abdominal girth. Last A1C was 5.8 %. We will recheck labs, still taking Metformin but off Rybelsus due to cost    Perennial Allergic Rhinitis he takes Singulair at night  xyzal and seems to control symptoms  .   Low Testosterone and ED : under the care of Dr. Trish Mage, unable to afford Clomiphene but he was  taking Cialis 10 mg every other day   instead of Viagra and seems to work better for him, but he is now on Cialis 5 mg daily and seem to work the best for him . Wellbutrin helps with energy level   Patient Active Problem List   Diagnosis Date Noted   Tachycardia 08/08/2017   Hypertriglyceridemia 05/28/2016    Hypertension 11/24/2015   Perennial allergic rhinitis with seasonal variation 12/10/2014   Asthma, moderate persistent 12/10/2014   Dyslipidemia 12/10/2014   Obesity, Class III, BMI 40-49.9 (morbid obesity) (HCC) 12/10/2014    Past Surgical History:  Procedure Laterality Date   TONSILECTOMY/ADENOIDECTOMY WITH MYRINGOTOMY      Family History  Problem Relation Age of Onset   Hyperlipidemia Mother    Hypertension Mother    Hypothyroidism Mother    Heart block Mother    Benign prostatic hyperplasia Father    Prostatitis Father        Had prostate removed due to being enlarged   Hyperlipidemia Sister    Hypertension Sister    Asthma Sister    CAD Maternal Grandfather    Hypertension Maternal Grandfather    Psoriasis Maternal Grandfather    Dementia Maternal Grandfather    Congestive Heart Failure Paternal Grandfather    Diabetes Paternal Grandfather    Arthritis Paternal Grandfather    Kidney disease Paternal Grandfather    Diabetes Maternal Grandmother    Arthritis Maternal Grandmother    Diabetes Paternal Grandmother    Arthritis Paternal Grandmother    Hypertension Sister    Hyperlipidemia Sister  Asthma Sister     Social History   Tobacco Use   Smoking status: Never   Smokeless tobacco: Former    Types: Chew    Quit date: 11/29/2017  Substance Use Topics   Alcohol use: No    Alcohol/week: 0.0 standard drinks of alcohol     Current Outpatient Medications:    albuterol (VENTOLIN HFA) 108 (90 Base) MCG/ACT inhaler, INHALE 2 PUFFS BY MOUTH EVERY 6 HOURS AS NEEDED FOR WHEEZING AND SHORTNESS OF BREATH, Disp: 8.5 each, Rfl: 0   Alpha Lipoic Acid 200 MG CAPS, Take by mouth daily., Disp: , Rfl:    azelastine (ASTELIN) 0.1 % nasal spray, Place 1 spray into both nostrils as needed., Disp: 30 mL, Rfl: 1   budesonide-formoterol (SYMBICORT) 160-4.5 MCG/ACT inhaler, Inhale 2 puffs into the lungs 2 (two) times daily., Disp: 30.6 each, Rfl: 2   buPROPion (WELLBUTRIN XL) 150  MG 24 hr tablet, Take 1 tablet (150 mg total) by mouth daily., Disp: 90 tablet, Rfl: 2   cetirizine (ZYRTEC) 10 MG tablet, Take 10 mg by mouth daily., Disp: , Rfl:    Cholecalciferol (VITAMIN D) 50 MCG (2000 UT) CAPS, Take 1 capsule by mouth daily., Disp: , Rfl:    Cyanocobalamin (VITAMIN B 12 PO), Take 1,000 mg by mouth daily., Disp: , Rfl:    fluticasone (FLONASE) 50 MCG/ACT nasal spray, Place 2 sprays into both nostrils as needed for allergies., Disp: 16 g, Rfl: 1   metFORMIN (GLUCOPHAGE XR) 500 MG 24 hr tablet, Take 1 tablet (500 mg total) by mouth daily with breakfast., Disp: 90 tablet, Rfl: 2   montelukast (SINGULAIR) 10 MG tablet, Take 1 tablet (10 mg total) by mouth daily., Disp: 90 tablet, Rfl: 2   tadalafil (CIALIS) 5 MG tablet, Take 1 tablet (5 mg total) by mouth daily., Disp: 90 tablet, Rfl: 2   valsartan (DIOVAN) 320 MG tablet, Take 1 tablet (320 mg total) by mouth daily. In place of Olmasartan, Disp: 90 tablet, Rfl: 2   atenolol (TENORMIN) 25 MG tablet, Take 1 tablet (25 mg total) by mouth daily. (Patient not taking: Reported on 04/27/2023), Disp: 90 tablet, Rfl: 2   Semaglutide (RYBELSUS) 14 MG TABS, Take 1 tablet (14 mg total) by mouth daily. (Patient not taking: Reported on 04/27/2023), Disp: 90 tablet, Rfl: 2  Allergies  Allergen Reactions   Aspirin    Tetanus Toxoids Other (See Comments)    Flared up Asthma    I personally reviewed active problem list, medication list, allergies, family history with the patient/caregiver today.   ROS  Ten systems reviewed and is negative except as mentioned in HPI    Objective  Vitals:   04/27/23 1512  BP: 126/80  Pulse: 97  Resp: 16  SpO2: 95%  Weight: 274 lb 1.6 oz (124.3 kg)  Height: 5\' 6"  (1.676 m)    Body mass index is 44.24 kg/m.  Physical Exam  Constitutional: Patient appears well-developed and well-nourished. Obese  No distress.  HEENT: head atraumatic, normocephalic, pupils equal and reactive to light, neck  supple Cardiovascular: Normal rate, regular rhythm and normal heart sounds.  No murmur heard. No BLE edema. Pulmonary/Chest: Effort normal and breath sounds normal. No respiratory distress. Abdominal: Soft.  There is no tenderness. Psychiatric: Patient has a normal mood and affect. behavior is normal. Judgment and thought content normal.   Diabetic Foot Exam:     PHQ2/9:    04/27/2023    3:11 PM 09/27/2022    2:52 PM  03/29/2022    2:56 PM 09/22/2021    3:06 PM 06/09/2021    3:20 PM  Depression screen PHQ 2/9  Decreased Interest 0 0 0 0 0  Down, Depressed, Hopeless 0 0 0 0 0  PHQ - 2 Score 0 0 0 0 0  Altered sleeping 0 0 1 0 0  Tired, decreased energy 0 0 1 0 0  Change in appetite 0 0 0 0 0  Feeling bad or failure about yourself  0 0 0 0 0  Trouble concentrating 0 0 0 0 0  Moving slowly or fidgety/restless 0 0 0 0 0  Suicidal thoughts 0 0 0 0 0  PHQ-9 Score 0 0 2 0 0  Difficult doing work/chores Not difficult at all        phq 9 is negative  Fall Risk:    09/27/2022    2:52 PM 03/29/2022    2:56 PM 09/22/2021    2:59 PM 06/09/2021    3:20 PM 03/24/2021    9:22 AM  Fall Risk   Falls in the past year? 0 0 0 0 0  Number falls in past yr: 0 0  0 0  Injury with Fall? 0 0  0 0  Risk for fall due to : No Fall Risks No Fall Risks No Fall Risks No Fall Risks No Fall Risks  Follow up Falls prevention discussed Falls prevention discussed Falls prevention discussed Falls prevention discussed Falls prevention discussed     Assessment & Plan   1. Essential hypertension (Primary)  - CBC with Differential/Platelet - COMPLETE METABOLIC PANEL WITH GFR - valsartan (DIOVAN) 320 MG tablet; Take 1 tablet (320 mg total) by mouth daily. In place of Olmasartan  Dispense: 90 tablet; Refill: 1  2. Obesity, Class III, BMI 40-49.9 (morbid obesity) (HCC)  Continue life style modification   3. Dyslipidemia  - Lipid panel  4. Metabolic syndrome  - Hemoglobin A1c - metFORMIN (GLUCOPHAGE XR)  500 MG 24 hr tablet; Take 1 tablet (500 mg total) by mouth daily with breakfast.  Dispense: 90 tablet; Refill: 1  5. Perennial allergic rhinitis with seasonal variation   montelukast (SINGULAIR) 10 MG tablet; Take 1 tablet (10 mg total) by mouth daily.  Dispense: 90 tablet; Refill: 1  6. Moderate persistent asthma without complication  - budesonide-formoterol (SYMBICORT) 160-4.5 MCG/ACT inhaler; Inhale 2 puffs into the lungs 2 (two) times daily.  Dispense: 30.6 each; Refill: 1 - montelukast (SINGULAIR) 10 MG tablet; Take 1 tablet (10 mg total) by mouth daily.  Dispense: 90 tablet; Refill: 1  7. Low testosterone in male  - buPROPion (WELLBUTRIN XL) 150 MG 24 hr tablet; Take 1 tablet (150 mg total) by mouth daily.  Dispense: 90 tablet; Refill: 1  8. Other fatigue  - COMPLETE METABOLIC PANEL WITH GFR - buPROPion (WELLBUTRIN XL) 150 MG 24 hr tablet; Take 1 tablet (150 mg total) by mouth daily.  Dispense: 90 tablet; Refill: 1  9. Erectile dysfunction, unspecified erectile dysfunction type  - tadalafil (CIALIS) 5 MG tablet; Take 1 tablet (5 mg total) by mouth daily.  Dispense: 90 tablet; Refill: 1

## 2023-04-28 ENCOUNTER — Encounter: Payer: Self-pay | Admitting: Family Medicine

## 2023-04-28 LAB — CBC WITH DIFFERENTIAL/PLATELET
Absolute Lymphocytes: 2457 {cells}/uL (ref 850–3900)
Absolute Monocytes: 648 {cells}/uL (ref 200–950)
Basophils Absolute: 47 {cells}/uL (ref 0–200)
Basophils Relative: 0.6 %
Eosinophils Absolute: 166 {cells}/uL (ref 15–500)
Eosinophils Relative: 2.1 %
HCT: 43.3 % (ref 38.5–50.0)
Hemoglobin: 14.7 g/dL (ref 13.2–17.1)
MCH: 30.4 pg (ref 27.0–33.0)
MCHC: 33.9 g/dL (ref 32.0–36.0)
MCV: 89.5 fL (ref 80.0–100.0)
MPV: 10.2 fL (ref 7.5–12.5)
Monocytes Relative: 8.2 %
Neutro Abs: 4582 {cells}/uL (ref 1500–7800)
Neutrophils Relative %: 58 %
Platelets: 386 10*3/uL (ref 140–400)
RBC: 4.84 10*6/uL (ref 4.20–5.80)
RDW: 12.5 % (ref 11.0–15.0)
Total Lymphocyte: 31.1 %
WBC: 7.9 10*3/uL (ref 3.8–10.8)

## 2023-04-28 LAB — COMPLETE METABOLIC PANEL WITH GFR
AG Ratio: 1.8 (calc) (ref 1.0–2.5)
ALT: 33 U/L (ref 9–46)
AST: 25 U/L (ref 10–35)
Albumin: 4.6 g/dL (ref 3.6–5.1)
Alkaline phosphatase (APISO): 69 U/L (ref 35–144)
BUN: 18 mg/dL (ref 7–25)
CO2: 29 mmol/L (ref 20–32)
Calcium: 9.7 mg/dL (ref 8.6–10.3)
Chloride: 103 mmol/L (ref 98–110)
Creat: 1.26 mg/dL (ref 0.70–1.30)
Globulin: 2.6 g/dL (ref 1.9–3.7)
Glucose, Bld: 78 mg/dL (ref 65–99)
Potassium: 4.6 mmol/L (ref 3.5–5.3)
Sodium: 139 mmol/L (ref 135–146)
Total Bilirubin: 0.4 mg/dL (ref 0.2–1.2)
Total Protein: 7.2 g/dL (ref 6.1–8.1)
eGFR: 69 mL/min/{1.73_m2} (ref >=60)

## 2023-04-28 LAB — LIPID PANEL
Cholesterol: 197 mg/dL (ref <200)
HDL: 49 mg/dL (ref >=40)
LDL Cholesterol (Calc): 97 mg/dL
Non-HDL Cholesterol (Calc): 148 mg/dL — ABNORMAL HIGH (ref <130)
Total CHOL/HDL Ratio: 4 (calc) (ref <5.0)
Triglycerides: 385 mg/dL — ABNORMAL HIGH (ref <150)

## 2023-04-28 LAB — HEMOGLOBIN A1C
Hgb A1c MFr Bld: 5.7 %{Hb} — ABNORMAL HIGH (ref <5.7)
Mean Plasma Glucose: 117 mg/dL
eAG (mmol/L): 6.5 mmol/L

## 2023-08-02 ENCOUNTER — Encounter: Admitting: Family Medicine

## 2023-08-04 ENCOUNTER — Encounter: Payer: Self-pay | Admitting: Family Medicine

## 2023-08-04 ENCOUNTER — Ambulatory Visit (INDEPENDENT_AMBULATORY_CARE_PROVIDER_SITE_OTHER): Admitting: Family Medicine

## 2023-08-04 VITALS — BP 116/70 | HR 96 | Resp 16 | Ht 66.0 in | Wt 275.4 lb

## 2023-08-04 DIAGNOSIS — Z0000 Encounter for general adult medical examination without abnormal findings: Secondary | ICD-10-CM

## 2023-08-04 DIAGNOSIS — Z1211 Encounter for screening for malignant neoplasm of colon: Secondary | ICD-10-CM | POA: Diagnosis not present

## 2023-08-04 DIAGNOSIS — Z822 Family history of deafness and hearing loss: Secondary | ICD-10-CM | POA: Diagnosis not present

## 2023-08-04 NOTE — Progress Notes (Signed)
 Name: Marquett Bertoli   MRN: [id number]    DOB: [birth year]   Date:08/04/2023       Progress Note  Subjective  Chief Complaint  Chief Complaint  Patient presents with   Annual Exam    HPI  Patient presents for annual CPE .   IPSS     Row Name 08/04/23 1514         International Prostate Symptom Score   How often have you had the sensation of not emptying your bladder? Not at All     How often have you had to urinate less than every two hours? Not at All     How often have you found you stopped and started again several times when you urinated? Not at All     How often have you found it difficult to postpone urination? Not at All     How often have you had a weak urinary stream? Not at All     How often have you had to strain to start urination? Not at All     How many times did you typically get up at night to urinate? 2 Times     Total IPSS Score 2       Quality of Life due to urinary symptoms   If you were to spend the rest of your life with your urinary condition just the way it is now how would you feel about that? Delighted        Diet: cutting down on sodas Exercise: he has an active job Last Dental Exam: he needs to schedule a visit  Last Eye Exam: up to date   Depression: phq 9 is negative    08/04/2023    3:09 PM 04/27/2023    3:11 PM 09/27/2022    2:52 PM 03/29/2022    2:56 PM 09/22/2021    3:06 PM  Depression screen PHQ 2/9  Decreased Interest 0 0 0 0 0  Down, Depressed, Hopeless 0 0 0 0 0  PHQ - 2 Score 0 0 0 0 0  Altered sleeping  0 0 1 0  Tired, decreased energy  0 0 1 0  Change in appetite  0 0 0 0  Feeling bad or failure about yourself   0 0 0 0  Trouble concentrating  0 0 0 0  Moving slowly or fidgety/restless  0 0 0 0  Suicidal thoughts  0 0 0 0  PHQ-9 Score  0 0 2 0  Difficult doing work/chores  Not difficult at all       Hypertension:  BP Readings from Last 3 Encounters:  08/04/23 116/70  04/27/23 126/80  09/27/22 126/72     Obesity: Wt Readings from Last 3 Encounters:  08/04/23 275 lb 6.4 oz (124.9 kg)  04/27/23 274 lb 1.6 oz (124.3 kg)  09/27/22 281 lb (127.5 kg)   BMI Readings from Last 3 Encounters:  08/04/23 44.45 kg/m  04/27/23 44.24 kg/m  09/27/22 45.35 kg/m     [employer] Visit from 08/04/2023 in [facility]  AUDIT-C Score 1     Married STD testing and prevention (HIV/chl/gon/syphilis):  not applicable Sexual history: one partner  Hep C Screening: completed Skin cancer: Discussed monitoring for atypical lesions Colorectal cancer: he has colguard box at home but did not do it yet  Prostate cancer:  not applicable Lab Results  Component Value Date   PSA 0.3 12/09/2011     Lung cancer:  Low Dose CT Chest recommended if Age 32-80 years, 30 pack-year currently smoking OR have quit w/in 15years. Patient  is not a candidate for screening   AAA: The USPSTF recommends one-time screening with ultrasonography in men ages 37 to 75 years who have ever smoked. Patient   is not a candidate for screening  ECG: 2022  Vaccines: reviewed with the patient.   Advanced Care Planning: A voluntary discussion about advance care planning including the explanation and discussion of advance directives.  Discussed health care proxy and Living will, and the patient was able to identify a health care proxy as wife .  Patient does not have a living will and power of attorney of health care   Patient Active Problem List   Diagnosis Date Noted   Tachycardia 08/08/2017   Hypertriglyceridemia 05/28/2016   Hypertension 11/24/2015   Perennial allergic rhinitis with seasonal variation 12/10/2014   Asthma, moderate persistent 12/10/2014   Dyslipidemia 12/10/2014   Obesity, Class III, BMI 40-49.9 (morbid obesity) 12/10/2014    Past Surgical History:  Procedure Laterality Date   TONSILECTOMY/ADENOIDECTOMY WITH MYRINGOTOMY      Family History  Problem Relation Age of Onset    Hyperlipidemia Mother    Hypertension Mother    Hypothyroidism Mother    Heart block Mother    Benign prostatic hyperplasia Father    Prostatitis Father        Had prostate removed due to being enlarged   Hyperlipidemia Sister    Hypertension Sister    Asthma Sister    CAD Maternal Grandfather    Hypertension Maternal Grandfather    Psoriasis Maternal Grandfather    Dementia Maternal Grandfather    Congestive Heart Failure Paternal Grandfather    Diabetes Paternal Grandfather    Arthritis Paternal Grandfather    Kidney disease Paternal Grandfather    Diabetes Maternal Grandmother    Arthritis Maternal Grandmother    Diabetes Paternal Grandmother    Arthritis Paternal Grandmother    Hypertension Sister    Hyperlipidemia Sister    Asthma Sister     Social History   Socioeconomic History   Marital status: Married    Spouse name: Dina Francisco   Number of children: 0   Years of education: Not on file   Highest education level: 12th grade  Occupational History   Not on file  Tobacco Use   Smoking status: Never   Smokeless tobacco: Former    Types: Chew    Quit date: 11/29/2017  Vaping Use   Vaping status: Never Used  Substance and Sexual Activity   Alcohol use: Yes    Comment: rarely   Drug use: No   Sexual activity: Yes    Partners: Female  Other Topics Concern   Not on file  Social History Narrative   Married, no children   Social Drivers of Health   Financial Resource Strain: Low Risk  (08/04/2023)   Overall Financial Resource Strain (CARDIA)    Difficulty of Paying Living Expenses: Not hard at all  Food Insecurity: No Food Insecurity (08/04/2023)   Hunger Vital Sign    Worried About Running Out of Food in the Last Year: Never true    Ran Out of Food in the Last Year: Never true  Transportation Needs: No Transportation Needs (08/04/2023)   PRAPARE - Administrator, Civil Service (Medical): No    Lack of Transportation (Non-Medical): No  Physical  Activity: Sufficiently Active (08/04/2023)   Exercise Vital Sign  Days of Exercise per Week: 5 days    Minutes of Exercise per Session: 30 min  Stress: Stress Concern Present (08/04/2023)   [employer] of Occupational Health - Occupational Stress Questionnaire    Feeling of Stress: Rather much  Social Connections: Moderately Integrated (08/04/2023)   Social Connection and Isolation Panel    Frequency of Communication with Friends and Family: More than three times a week    Frequency of Social Gatherings with Friends and Family: Once a week    Attends Religious Services: More than 4 times per year    Active Member of [employer] or Organizations: No    Attends Banker Meetings: Never    Marital Status: Married  Catering manager Violence: Not At Risk (08/04/2023)   Humiliation, Afraid, Rape, and Kick questionnaire    Fear of Current or Ex-Partner: No    Emotionally Abused: No    Physically Abused: No    Sexually Abused: No     Current Outpatient Medications:    albuterol  (VENTOLIN  HFA) 108 (90 Base) MCG/ACT inhaler, INHALE 2 PUFFS BY MOUTH EVERY 6 HOURS AS NEEDED FOR WHEEZING AND SHORTNESS OF BREATH, Disp: 8.5 each, Rfl: 0   Alpha Lipoic Acid 200 MG CAPS, Take by mouth daily., Disp: , Rfl:    azelastine  (ASTELIN ) 0.1 % nasal spray, Place 1 spray into both nostrils as needed., Disp: 30 mL, Rfl: 1   budesonide -formoterol  (SYMBICORT ) 160-4.5 MCG/ACT inhaler, Inhale 2 puffs into the lungs 2 (two) times daily., Disp: 30.6 each, Rfl: 1   buPROPion  (WELLBUTRIN  XL) 150 MG 24 hr tablet, Take 1 tablet (150 mg total) by mouth daily., Disp: 90 tablet, Rfl: 1   cetirizine (ZYRTEC) 10 MG tablet, Take 10 mg by mouth daily., Disp: , Rfl:    Cholecalciferol (VITAMIN D) 50 MCG (2000 UT) CAPS, Take 1 capsule by mouth daily., Disp: , Rfl:    Cyanocobalamin (VITAMIN B 12 PO), Take 1,000 mg by mouth daily., Disp: , Rfl:    fluticasone  (FLONASE ) 50 MCG/ACT nasal spray, Place 2 sprays into both  nostrils as needed for allergies., Disp: 16 g, Rfl: 1   metFORMIN  (GLUCOPHAGE  XR) 500 MG 24 hr tablet, Take 1 tablet (500 mg total) by mouth daily with breakfast., Disp: 90 tablet, Rfl: 1   montelukast  (SINGULAIR ) 10 MG tablet, Take 1 tablet (10 mg total) by mouth daily., Disp: 90 tablet, Rfl: 1   tadalafil  (CIALIS ) 5 MG tablet, Take 1 tablet (5 mg total) by mouth daily., Disp: 90 tablet, Rfl: 1   valsartan  (DIOVAN ) 320 MG tablet, Take 1 tablet (320 mg total) by mouth daily. In place of Olmasartan, Disp: 90 tablet, Rfl: 1  Allergies  Allergen Reactions   Aspirin    Tetanus Toxoids Other (See Comments)    Flared up Asthma     ROS  Constitutional: Negative for fever or weight change.  Respiratory: Negative for cough and shortness of breath.   Cardiovascular: Negative for chest pain or palpitations.  Gastrointestinal: Negative for abdominal pain, no bowel changes.  Musculoskeletal: Negative for gait problem or joint swelling.  Skin: Negative for rash.  Neurological: Negative for dizziness or headache.  No other specific complaints in a complete review of systems (except as listed in HPI above).    Objective  Vitals:   08/04/23 1516  BP: 116/70  Pulse: 96  Resp: 16  SpO2: 98%  Weight: 275 lb 6.4 oz (124.9 kg)  Height: 5' 6 (1.676 m)    Body mass index  is 44.45 kg/m.  Physical Exam  Constitutional: Patient appears well-developed and well-nourished. No distress.  HENT: Head: Normocephalic and atraumatic. Ears: B TMs ok, no erythema or effusion; Nose: Nose normal. Mouth/Throat: Oropharynx is clear and moist. No oropharyngeal exudate.  Eyes: Conjunctivae and EOM are normal. Pupils are equal, round, and reactive to light. No scleral icterus.  Neck: Normal range of motion. Neck supple. No JVD present. No thyromegaly present.  Cardiovascular: Normal rate, regular rhythm and normal heart sounds.  No murmur heard. No BLE edema. Pulmonary/Chest: Effort normal and breath sounds  normal. No respiratory distress. Abdominal: Soft. Bowel sounds are normal, no distension. There is no tenderness. no masses MALE GENITALIA: refused  RECTAL: refused  Musculoskeletal: Normal range of motion, no joint effusions. No gross deformities Neurological: he is alert and oriented to person, place, and time. No cranial nerve deficit. Coordination, balance, strength, speech and gait are normal.  Skin: Skin is warm and dry. No rash noted. No erythema.  Psychiatric: Patient has a normal mood and affect. behavior is normal. Judgment and thought content normal.   {Show previous labs (optional):23779}  Assessment & Plan  1. Well adult exam (Primary)   2. Colon cancer screening  - Cologuard    -Prostate cancer screening and PSA options (with potential risks and benefits of testing vs not testing) were discussed along with recent recs/guidelines. -USPSTF grade A and B recommendations reviewed with patient; age-appropriate recommendations, preventive care, screening tests, etc discussed and encouraged; healthy living encouraged; see AVS for patient education given to patient -Discussed importance of 150 minutes of physical activity weekly, eat two servings of fish weekly, eat one serving of tree nuts ( cashews, pistachios, pecans, almonds.Aaron Aas) every other day, eat 6 servings of fruit/vegetables daily and drink plenty of water and avoid sweet beverages.  -Reviewed Health Maintenance: yes

## 2023-08-04 NOTE — Patient Instructions (Signed)

## 2023-08-18 LAB — COLOGUARD: COLOGUARD: NEGATIVE

## 2023-08-21 ENCOUNTER — Ambulatory Visit: Payer: Self-pay | Admitting: Family Medicine

## 2023-10-31 ENCOUNTER — Ambulatory Visit: Admitting: Family Medicine

## 2023-10-31 ENCOUNTER — Encounter: Payer: Self-pay | Admitting: Family Medicine

## 2023-10-31 VITALS — BP 134/80 | HR 96 | Resp 16 | Ht 66.0 in | Wt 282.7 lb

## 2023-10-31 DIAGNOSIS — Z23 Encounter for immunization: Secondary | ICD-10-CM

## 2023-10-31 DIAGNOSIS — R0683 Snoring: Secondary | ICD-10-CM | POA: Diagnosis not present

## 2023-10-31 DIAGNOSIS — Z6841 Body Mass Index (BMI) 40.0 and over, adult: Secondary | ICD-10-CM

## 2023-10-31 DIAGNOSIS — R7989 Other specified abnormal findings of blood chemistry: Secondary | ICD-10-CM

## 2023-10-31 DIAGNOSIS — E66813 Obesity, class 3: Secondary | ICD-10-CM

## 2023-10-31 DIAGNOSIS — J302 Other seasonal allergic rhinitis: Secondary | ICD-10-CM

## 2023-10-31 DIAGNOSIS — E8881 Metabolic syndrome: Secondary | ICD-10-CM

## 2023-10-31 DIAGNOSIS — J4540 Moderate persistent asthma, uncomplicated: Secondary | ICD-10-CM

## 2023-10-31 DIAGNOSIS — I10 Essential (primary) hypertension: Secondary | ICD-10-CM

## 2023-10-31 DIAGNOSIS — N529 Male erectile dysfunction, unspecified: Secondary | ICD-10-CM

## 2023-10-31 LAB — POCT GLYCOSYLATED HEMOGLOBIN (HGB A1C): Hemoglobin A1C: 5.4 % (ref 4.0–5.6)

## 2023-10-31 MED ORDER — MONTELUKAST SODIUM 10 MG PO TABS: 10.0000 mg | ORAL_TABLET | Freq: Every day | ORAL | 1 refills | Status: AC

## 2023-10-31 MED ORDER — LIRAGLUTIDE -WEIGHT MANAGEMENT 18 MG/3ML ~~LOC~~ SOPN: 0.6000 mg | PEN_INJECTOR | Freq: Every day | SUBCUTANEOUS | 0 refills | Status: DC

## 2023-10-31 MED ORDER — TADALAFIL 5 MG PO TABS: 5.0000 mg | ORAL_TABLET | Freq: Every day | ORAL | 1 refills | Status: AC

## 2023-10-31 MED ORDER — VALSARTAN 320 MG PO TABS: 320.0000 mg | ORAL_TABLET | Freq: Every day | ORAL | 1 refills | Status: AC

## 2023-10-31 MED ORDER — BUDESONIDE-FORMOTEROL FUMARATE 160-4.5 MCG/ACT IN AERO: 2.0000 | INHALATION_SPRAY | Freq: Two times a day (BID) | RESPIRATORY_TRACT | 1 refills | Status: AC

## 2023-10-31 MED ORDER — METFORMIN HCL ER 500 MG PO TB24: 500.0000 mg | ORAL_TABLET | Freq: Every day | ORAL | 1 refills | Status: AC

## 2023-10-31 NOTE — Progress Notes (Signed)
 Name: Matthew Barrett   MRN: [id number]    DOB: January 22, 1971   Date:10/31/2023       Progress Note  Subjective  Chief Complaint  Chief Complaint  Patient presents with   Medical Management of Chronic Issues   Discussed the use of AI scribe software for clinical note transcription with the patient, who gave verbal consent to proceed.  History of Present Illness Matthew Barrett is a 53 year old male with asthma, obesity, and metabolic syndrome who presents for a regular follow-up visit.  He contracted COVID-19 for the third time in the last week of August, coinciding with his vacation. Symptoms included a sore throat and headache. Despite receiving a COVID-19 vaccine and booster, he has contracted the virus multiple times.  He has a history of asthma, using Symbicort  daily, though only once a day instead of twice. Albuterol  is used as needed, about twice a week.  He takes valsartan  320 mg for hypertension. He notes his blood pressure is usually closer to normal at home, influenced by diet and stress.  He has metabolic syndrome and takes metformin  without issues. He wants to lose weight, he continues to try to eat balanced meals, pack his food for lunch and tries to walk but unable to lose weight . Previous attempts at weight loss medications were hindered by insurance coverage.  He experiences increased thirst and urination. Fatigue persists despite Wellbutrin , which he takes for low energy rather than depression.  He has erectile dysfunction, managed with daily Cialis . He has not undergone a sleep study but acknowledges risk factors for sleep apnea, including obesity and fatigue.  He takes Xyzal  for allergies and uses Flonase  as needed, managing nasal congestion with these medications. No chest pain, shortness of breath, or palpitations.    Patient Active Problem List   Diagnosis Date Noted   Tachycardia 08/08/2017   Hypertriglyceridemia 05/28/2016   Hypertension 11/24/2015   Perennial  allergic rhinitis with seasonal variation 12/10/2014   Asthma, moderate persistent 12/10/2014   Dyslipidemia 12/10/2014   Obesity, Class III, BMI 40-49.9 (morbid obesity) 12/10/2014    Past Surgical History:  Procedure Laterality Date   TONSILECTOMY/ADENOIDECTOMY WITH MYRINGOTOMY      Family History  Problem Relation Age of Onset   Hyperlipidemia Mother    Hypertension Mother    Hypothyroidism Mother    Heart block Mother    Benign prostatic hyperplasia Father    Prostatitis Father        Had prostate removed due to being enlarged   Hyperlipidemia Sister    Hypertension Sister    Asthma Sister    CAD Maternal Grandfather    Hypertension Maternal Grandfather    Psoriasis Maternal Grandfather    Dementia Maternal Grandfather    Congestive Heart Failure Paternal Grandfather    Diabetes Paternal Grandfather    Arthritis Paternal Grandfather    Kidney disease Paternal Grandfather    Diabetes Maternal Grandmother    Arthritis Maternal Grandmother    Diabetes Paternal Grandmother    Arthritis Paternal Grandmother    Hypertension Sister    Hyperlipidemia Sister    Asthma Sister     Social History   Tobacco Use   Smoking status: Never   Smokeless tobacco: Former    Types: Chew    Quit date: 11/29/2017  Substance Use Topics   Alcohol use: Yes    Comment: rarely     Current Outpatient Medications:    albuterol  (VENTOLIN  HFA) 108 (90 Base) MCG/ACT inhaler, INHALE 2 PUFFS  BY MOUTH EVERY 6 HOURS AS NEEDED FOR WHEEZING AND SHORTNESS OF BREATH, Disp: 8.5 each, Rfl: 0   Alpha Lipoic Acid 200 MG CAPS, Take by mouth daily., Disp: , Rfl:    azelastine  (ASTELIN ) 0.1 % nasal spray, Place 1 spray into both nostrils as needed., Disp: 30 mL, Rfl: 1   budesonide -formoterol  (SYMBICORT ) 160-4.5 MCG/ACT inhaler, Inhale 2 puffs into the lungs 2 (two) times daily., Disp: 30.6 each, Rfl: 1   buPROPion  (WELLBUTRIN  XL) 150 MG 24 hr tablet, Take 1 tablet (150 mg total) by mouth daily., Disp:  90 tablet, Rfl: 1   cetirizine (ZYRTEC) 10 MG tablet, Take 10 mg by mouth daily., Disp: , Rfl:    Cholecalciferol (VITAMIN D) 50 MCG (2000 UT) CAPS, Take 1 capsule by mouth daily., Disp: , Rfl:    Cyanocobalamin (VITAMIN B 12 PO), Take 1,000 mg by mouth daily., Disp: , Rfl:    fluticasone  (FLONASE ) 50 MCG/ACT nasal spray, Place 2 sprays into both nostrils as needed for allergies., Disp: 16 g, Rfl: 1   metFORMIN  (GLUCOPHAGE  XR) 500 MG 24 hr tablet, Take 1 tablet (500 mg total) by mouth daily with breakfast., Disp: 90 tablet, Rfl: 1   montelukast  (SINGULAIR ) 10 MG tablet, Take 1 tablet (10 mg total) by mouth daily., Disp: 90 tablet, Rfl: 1   tadalafil  (CIALIS ) 5 MG tablet, Take 1 tablet (5 mg total) by mouth daily., Disp: 90 tablet, Rfl: 1   valsartan  (DIOVAN ) 320 MG tablet, Take 1 tablet (320 mg total) by mouth daily. In place of Olmasartan, Disp: 90 tablet, Rfl: 1  Allergies  Allergen Reactions   Aspirin    Tetanus Toxoid-Containing Vaccines Other (See Comments)    Flared up Asthma    I personally reviewed active problem list, medication list, allergies, family history with the patient/caregiver today.   ROS  Ten systems reviewed and is negative except as mentioned in HPI    Objective Physical Exam VITALS: P- 96, BP- 134/80 CONSTITUTIONAL: Patient appears well-developed and well-nourished.  No distress. HEENT: Head atraumatic, normocephalic, neck supple. CARDIOVASCULAR: Normal rate, regular rhythm and normal heart sounds.  No murmur heard. No BLE edema. PULMONARY: Effort normal and breath sounds normal. No respiratory distress. ABDOMINAL: There is no tenderness or distention. MUSCULOSKELETAL: Normal gait. Without gross motor or sensory deficit. PSYCHIATRIC: Patient has a normal mood and affect. behavior is normal. Judgment and thought content normal.  Vitals:   10/31/23 1537  BP: 134/80  Pulse: 96  Resp: 16  SpO2: 96%  Weight: 282 lb 11.2 oz (128.2 kg)  Height: 5' 6 (1.676  m)    Body mass index is 45.63 kg/m.  Recent Results (from the past 2160 hours)  Cologuard     Status: None   Collection Time: 08/14/23 10:00 AM  Result Value Ref Range   COLOGUARD Negative Negative    Comment: The Cologuard (TM) test was performed on this specimen.  NEGATIVE TEST RESULT. A negative Cologuard result indicates a low likelihood that a colorectal cancer (CRC) or advanced adenoma (adenomatous polyps with more advanced pre-malignant features) is present. The chance that a person with a negative Cologuard test has a colorectal cancer is less than 1 in 1500 (negative predictive value >99.9%) or has an advanced adenoma is less than 5.3% (negative predictive value 94.7%). These data are based on a prospective cross-sectional study of 10,000 individuals at average risk for colorectal cancer who were screened with both Cologuard and colonoscopy. (Imperiale T. et al, LOISE [place] J Med  2014;370(14):1286-1297) The normal value (reference range) for this assay is negative.  COLOGUARD RE-SCREENING RECOMMENDATION: Periodic colorectal cancer screening is an important part of preventive healthcare for asymptomatic individuals at average risk for colorectal cancer. Following a negative Cologuard  result, the American Cancer Society and U.S. Multi-Society Task Force screening guidelines recommend a Cologuard re-screening interval of 3 years.  References: American Cancer Society Guideline for Colorectal Cancer Screening: https://www.cancer.org/cancer/colon-rectal-cancer/detection-diagnosis-staging/acs-recommendations.html.; Rex DK, Boland CR, Dominitz JK, Colorectal Cancer Screening: Recommendations for Physicians and Patients from the U.S. Multi-Society Task Force on Colorectal Cancer Screening , Am J Gastroenterology 2017; 112:1016-1030.  TEST DESCRIPTION: Composite algorithmic analysis of stool DNA-biomarkers with hemoglobin immunoassay.   Quantitative values of individual biomarkers are not reportable  and are not associated with individual biomarker result reference ranges. Cologuard is intended for colorectal cancer screening of adults of either sex, 45 years or older, who are at average-risk for colorectal cancer (CRC). Cologuard has been approved for use by the U.S.  FDA. The performance of Cologuard was established in a cross sectional study of average-risk adults aged 62-84. Cologuard performance in patients ages 53 to 12 years was estimated by sub-group analysis of near-age groups. Colonoscopies performed for a positive result may find as the most clinically significant lesion: colorectal cancer [4.0%], advanced adenoma (including sessile serrated polyps greater than or equal to 1cm diameter) [20%] or non- advanced adenoma [31%]; or no colorectal neoplasia [45%]. These estimates are derived from a prospective cross-sectional screening study of 10,000 individuals at average risk for colorectal cancer who were screened with both Cologuard and colonoscopy. (Imperiale T. et al, LOISE [place] J Med 2014;370(14):1286-1297.) Cologuard may produce a false negative or false positive result (no colorectal cancer or precancerous polyp present at colonoscopy follow up). A negative Cologuard test result does not guarantee the absence of CRC or advanced adenoma  (pre-cancer). The current Cologuard screening interval is every 3 years. Science writer and [place] Therapist, music). Cologuard performance data in a 10,000 patient pivotal study using colonoscopy as the reference method can be accessed at the following location: www.exactl[employer].com/results. Additional description of the Cologuard test process, warnings and precautions can be found at www.cologuard.com.     PHQ2/9:    10/31/2023    3:34 PM 08/04/2023    3:09 PM 04/27/2023    3:11 PM 09/27/2022    2:52 PM 03/29/2022    2:56 PM  Depression screen PHQ 2/9  Decreased Interest 0 0 0 0 0  Down, Depressed, Hopeless 0 0 0 0 0  PHQ - 2 Score 0 0 0 0 0   Altered sleeping   0 0 1  Tired, decreased energy   0 0 1  Change in appetite   0 0 0  Feeling bad or failure about yourself    0 0 0  Trouble concentrating   0 0 0  Moving slowly or fidgety/restless   0 0 0  Suicidal thoughts   0 0 0  PHQ-9 Score   0 0 2  Difficult doing work/chores   Not difficult at all      phq 9 is negative  Fall Risk:    10/31/2023    3:34 PM 08/04/2023    3:09 PM 09/27/2022    2:52 PM 03/29/2022    2:56 PM 09/22/2021    2:59 PM  Fall Risk   Falls in the past year? 0 0 0 0 0  Number falls in past yr: 0 0 0 0   Injury with  Fall? 0 0 0 0   Risk for fall due to : No Fall Risks No Fall Risks No Fall Risks No Fall Risks No Fall Risks  Follow up Falls evaluation completed Falls prevention discussed;Education provided;Falls evaluation completed Falls prevention discussed Falls prevention discussed Falls prevention discussed      Data saved with a previous flowsheet row definition      Assessment & Plan Obesity, class 3 with metabolic syndrome Obesity with metabolic syndrome, considering weight loss options. Liver enzymes slightly elevated, suspecting possible fatty liver. - Continue metformin . - Start Saxenda  at 0.6 mg daily, titrate to 3 mg as tolerated. - Follow-up in 3 months to assess weight loss. - Check A1c for diabetes evaluation.  Suspected obstructive sleep apnea Suspected due to obesity, hypertension, snoring, and fatigue. Discussed potential benefits of a sleep study. Prefers a home sleep study if insurance allows. - Order home sleep study, contingent on insurance coverage.  Fatigue Possibly related to suspected sleep apnea and obesity. Wellbutrin  previously used without significant improvement. Discussed potential link to sleep apnea. - Proceed with sleep study to evaluate for sleep apnea.  Essential hypertension Blood pressure slightly elevated at 134/80 mmHg, likely due to situational factors. Home readings closer to normal. Discussed  maintaining blood pressure below 130/80 mmHg. - Continue valsartan  320 mg daily. - Recheck blood pressure before leaving clinic.  Moderate persistent asthma Asthma managed with Symbicort  and albuterol . Symbicort  used inconsistently. Discussed optimizing control by adjusting Symbicort  usage. - Use Symbicort  twice daily, add afternoon dose if needed. - Discontinue albuterol  if Symbicort  effective.  Allergic rhinitis Managed with Xyzal  and Flonase . Congestion noted. Alternates between Xyzal  and Zyrtec based on symptoms. - Continue Xyzal  and Flonase  as needed.  Male erectile dysfunction Managed with daily Cialis . - Continue Cialis  daily.

## 2023-11-01 ENCOUNTER — Other Ambulatory Visit: Payer: Self-pay | Admitting: Family Medicine

## 2023-11-01 ENCOUNTER — Telehealth: Payer: Self-pay | Admitting: Family Medicine

## 2023-11-01 DIAGNOSIS — E66813 Obesity, class 3: Secondary | ICD-10-CM

## 2023-11-01 DIAGNOSIS — E8881 Metabolic syndrome: Secondary | ICD-10-CM

## 2023-11-01 MED ORDER — LIRAGLUTIDE -WEIGHT MANAGEMENT 18 MG/3ML ~~LOC~~ SOPN: 0.6000 mg | PEN_INJECTOR | Freq: Every day | SUBCUTANEOUS | 0 refills | Status: AC

## 2023-11-01 NOTE — Telephone Encounter (Signed)
No answer left detailed vm

## 2023-11-01 NOTE — Telephone Encounter (Signed)
**Note De-identified  [place] Obfuscation** Please advise 

## 2023-11-01 NOTE — Telephone Encounter (Signed)
 Copied from CRM [id number]. Topic: Clinical - Prescription Issue >> Nov 01, 2023  8:57 AM Tiffini S wrote: Reason for CRM: Patient called about new medication that is a injection- prescription was for a 30 day supply for Liraglutide  -Weight Management (SAXENDA ) 18 MG/3ML [employer] will only cover a 90 day supply. Please resubmit the prescription to [facility] for a 90 day supply. Patient wasn't able to purchase the medicine- still pending with pharmacy.  [facility] [address], [place] - [address] [place] [address] Phone: [phone] Fax: [phone]   Please call the patient back with an update at Work number [phone]

## 2023-12-04 ENCOUNTER — Encounter: Payer: Self-pay | Admitting: Family Medicine

## 2024-01-30 ENCOUNTER — Ambulatory Visit: Admitting: Family Medicine

## 2024-02-29 ENCOUNTER — Ambulatory Visit: Admitting: Nurse Practitioner

## 2024-02-29 ENCOUNTER — Encounter: Payer: Self-pay | Admitting: Nurse Practitioner

## 2024-02-29 VITALS — BP 134/82 | HR 105 | Temp 98.0°F | Ht 66.0 in | Wt 281.0 lb

## 2024-02-29 DIAGNOSIS — R052 Subacute cough: Secondary | ICD-10-CM

## 2024-02-29 DIAGNOSIS — J4540 Moderate persistent asthma, uncomplicated: Secondary | ICD-10-CM | POA: Diagnosis not present

## 2024-02-29 MED ORDER — PREDNISONE 20 MG PO TABS: 20.0000 mg | ORAL_TABLET | Freq: Every day | ORAL | 0 refills | Status: AC

## 2024-02-29 MED ORDER — AZITHROMYCIN 250 MG PO TABS: ORAL_TABLET | ORAL | 0 refills | Status: AC

## 2024-02-29 MED ORDER — BENZONATATE 100 MG PO CAPS: 200.0000 mg | ORAL_CAPSULE | Freq: Two times a day (BID) | ORAL | 0 refills | Status: AC | PRN

## 2024-02-29 MED ORDER — ALBUTEROL SULFATE HFA 108 (90 BASE) MCG/ACT IN AERS: INHALATION_SPRAY | RESPIRATORY_TRACT | 5 refills | Status: AC

## 2024-02-29 MED ORDER — PROMETHAZINE-DM 6.25-15 MG/5ML PO SYRP: 5.0000 mL | ORAL_SOLUTION | Freq: Four times a day (QID) | ORAL | 0 refills | Status: AC | PRN

## 2024-02-29 NOTE — Progress Notes (Signed)
 "  BP 134/82   Pulse (!) 105   Temp 98 F (36.7 C)   Ht 5' 6 (1.676 m)   Wt 281 lb (127.5 kg)   SpO2 96%   BMI 45.35 kg/m    Subjective:    Patient ID: Matthew Barrett, male    DOB: [birth year], 54 y.o.   MRN: [id number]  HPI: Matthew Barrett is a 54 y.o. male  Chief Complaint  Patient presents with   Cough    Pt c/o cough x3 weeks.    Discussed the use of AI scribe software for clinical note transcription with the patient, who gave verbal consent to proceed.  History of Present Illness Matthew Barrett is a 54 year old male with asthma who presents with a persistent cough.  Cough - Persistent since around Christmas Eve - Intermittent with episodes resembling bronchial spasms - Minimally responsive to over-the-counter medications including Nyquil, DayQuil, and Delsym - Slight improvement over time but remains bothersome, especially at work - Relies on cough drops for symptomatic relief at work - No current fever; mild fever of 99.24F occurred one to two weeks ago  Asthma symptoms and exacerbations - Asthma does not frequently flare up, but exacerbations are difficult to manage when he occurs - No current rescue inhaler available, which is necessary during exacerbations - No shortness of breath except during bronchospasm episodes  Medication tolerance and allergies - Current medications: albuterol  inhaler as needed, Symbicort  twice daily, Flonase , Xyzal  daily, Singulair  10 mg daily - Tolerates prednisone  well - No known reactions to azithromycin  - Allergic to tetanus and aspirin  Functional status - Works in a warehouse requiring physical activity - Cough has slowed him down somewhat, but otherwise feels well         10/31/2023    3:34 PM 08/04/2023    3:09 PM 04/27/2023    3:11 PM  Depression screen PHQ 2/9  Decreased Interest 0 0 0  Down, Depressed, Hopeless 0 0 0  PHQ - 2 Score 0 0 0  Altered sleeping   0  Tired, decreased energy   0  Change in appetite   0   Feeling bad or failure about yourself    0  Trouble concentrating   0  Moving slowly or fidgety/restless   0  Suicidal thoughts   0  PHQ-9 Score   0   Difficult doing work/chores   Not difficult at all     Data saved with a previous flowsheet row definition    Relevant past medical, surgical, family and social history reviewed and updated as indicated. Interim medical history since our last visit reviewed. Allergies and medications reviewed and updated.  Review of Systems  Ten systems reviewed and is negative except as mentioned in HPI      Objective:      BP 134/82   Pulse (!) 105   Temp 98 F (36.7 C)   Ht 5' 6 (1.676 m)   Wt 281 lb (127.5 kg)   SpO2 96%   BMI 45.35 kg/m    Wt Readings from Last 3 Encounters:  02/29/24 281 lb (127.5 kg)  10/31/23 282 lb 11.2 oz (128.2 kg)  08/04/23 275 lb 6.4 oz (124.9 kg)    Physical Exam GENERAL: Alert, cooperative, well developed, no acute distress. HEENT: Normocephalic, normal oropharynx, moist mucous membranes. CHEST: Wheezing and rhonchi present. CARDIOVASCULAR: Normal heart rate and rhythm, S1 and S2 normal without murmurs. ABDOMEN: Soft, non-tender, non-distended, without organomegaly, normal bowel sounds. EXTREMITIES:  No cyanosis or edema. NEUROLOGICAL: Cranial nerves grossly intact, moves all extremities without gross motor or sensory deficit.  Results for orders placed or performed in visit on 10/31/23  POCT HgB A1C   Collection Time: 10/31/23  4:19 PM  Result Value Ref Range   Hemoglobin A1C 5.4 4.0 - 5.6 %   HbA1c POC (<> result, manual entry)     HbA1c, POC (prediabetic range)     HbA1c, POC (controlled diabetic range)            Assessment & Plan:   Problem List Items Addressed This Visit       Respiratory   Asthma, moderate persistent   Relevant Medications   albuterol  (VENTOLIN  HFA) 108 (90 Base) MCG/ACT inhaler   predniSONE  (DELTASONE ) 20 MG tablet   azithromycin  (ZITHROMAX ) 250 MG tablet    Other Visit Diagnoses       Subacute cough    -  Primary   Relevant Medications   albuterol  (VENTOLIN  HFA) 108 (90 Base) MCG/ACT inhaler   predniSONE  (DELTASONE ) 20 MG tablet   azithromycin  (ZITHROMAX ) 250 MG tablet   benzonatate  (TESSALON ) 100 MG capsule   promethazine -dextromethorphan (PROMETHAZINE -DM) 6.25-15 MG/5ML syrup        Assessment and Plan Assessment & Plan Subacute cough Persisting since Christmas Eve, likely due to bronchitis. No fever currently, but mild fever noted two weeks ago. No shortness of breath except during bronchospasms. Wheezing and rales present on examination. Differential includes atypical pneumonia, hence azithromycin  prescribed. Cough is improving but still present, affecting daily activities. - Prescribed azithromycin  for potential atypical pneumonia. - Prescribed albuterol  inhaler for bronchospasms. - Prescribed prednisone  for inflammation. - Prescribed two types of cough medicine: one syrup to suppress cough and tessalon  perles to reduce cough trigger. - Advised to send a message if symptoms do not improve for potential chest x-ray.  Moderate persistent asthma Asthma is well-controlled with current medications, but recent cough has exacerbated symptoms. No recent asthma exacerbations requiring rescue inhaler, but bronchospasms noted during cough episodes. - Ensure availability of albuterol  inhaler for asthma exacerbations.        Follow up plan: Return if symptoms worsen or fail to improve. "

## 2024-03-27 ENCOUNTER — Ambulatory Visit: Admitting: Family Medicine

## 2024-08-06 ENCOUNTER — Encounter: Admitting: Family Medicine
